# Patient Record
Sex: Female | Born: 2000 | Race: White | Hispanic: No | Smoking: Never smoker
Health system: Southern US, Community
[De-identification: ages and names within clinical notes are randomized; demographics above are authoritative.]

## PROBLEM LIST (undated history)

## (undated) ENCOUNTER — Inpatient Hospital Stay: Payer: Self-pay

## (undated) DIAGNOSIS — F909 Attention-deficit hyperactivity disorder, unspecified type: Secondary | ICD-10-CM

## (undated) DIAGNOSIS — F319 Bipolar disorder, unspecified: Secondary | ICD-10-CM

## (undated) DIAGNOSIS — T7840XA Allergy, unspecified, initial encounter: Secondary | ICD-10-CM

## (undated) HISTORY — PX: DENTAL SURGERY: SHX609

---
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
## 2020-10-20 ENCOUNTER — Other Ambulatory Visit: Payer: Self-pay

## 2020-10-20 ENCOUNTER — Emergency Department
Admission: EM | Admit: 2020-10-20 | Discharge: 2020-10-20 | Disposition: A | Discharge status: Home / Self Care | Payer: Medicaid - Out of State | Attending: Emergency Medicine | Admitting: Emergency Medicine

## 2020-10-20 DIAGNOSIS — Z87891 Personal history of nicotine dependence: Secondary | ICD-10-CM | POA: Diagnosis not present

## 2020-10-20 DIAGNOSIS — L03213 Periorbital cellulitis: Secondary | ICD-10-CM | POA: Diagnosis not present

## 2020-10-20 DIAGNOSIS — H5712 Ocular pain, left eye: Secondary | ICD-10-CM | POA: Diagnosis present

## 2020-10-20 MED ORDER — AMOXICILLIN-POT CLAVULANATE 875-125 MG PO TABS: 1.0000 | ORAL_TABLET | Freq: Two times a day (BID) | ORAL | 0 refills | Status: AC

## 2020-10-20 MED ORDER — FLUORESCEIN SODIUM 1 MG OP STRP
1.0000 | ORAL_STRIP | Freq: Once | OPHTHALMIC | Status: AC
  Administered 2020-10-20: 1 via OPHTHALMIC
  Filled 2020-10-20: qty 1

## 2020-10-20 MED ORDER — ERYTHROMYCIN 5 MG/GM OP OINT: 1.0000 "application " | TOPICAL_OINTMENT | Freq: Two times a day (BID) | OPHTHALMIC | 0 refills | Status: AC

## 2020-10-20 MED ORDER — TETRACAINE HCL 0.5 % OP SOLN
1.0000 [drp] | Freq: Once | OPHTHALMIC | Status: AC
  Administered 2020-10-20: 1 [drp] via OPHTHALMIC
  Filled 2020-10-20: qty 4

## 2020-10-20 NOTE — ED Provider Notes (Signed)
[facility] Emergency Department Provider Note  ____________________________________________   Event Date/Time   First MD Initiated Contact with Patient 10/20/20 1846     (approximate)  I have reviewed the triage vital signs and the nursing notes.   HISTORY  Chief Complaint Eye Pain   HPI Crystal Melendez is a 20 y.o. female with a past medical history of bipolar disorder and ADHD who presents for assessment of some left lower eye pain and redness with worsening tearing that she states began last night.  She does not wear contacts or glasses and does not think she was around any foreign bodies or metal objects could have gotten in her eye.  She has no symptoms in her right eye.  She has no double vision or blurry vision.  She is no earache, sore throat, chest pain, cough, shortness of breath, fevers, chills, Donnell pain, vomiting, diarrhea, dysuria, rash or any other acute sick symptoms.  No other acute concerns at this time         Past Medical History:  Diagnosis Date   ADHD    Allergy    Bipolar 1 disorder [facility])     Patient Active Problem List   Diagnosis Date Noted   Adjustment disorder with depressed mood    Labor and delivery indication for care or intervention 12/11/2018   Preterm uterine contractions in third trimester, antepartum 11/28/2018   Low back pain during pregnancy in third trimester 11/27/2018   Uterine contractions during pregnancy 11/27/2018   Pregnancy 11/26/2018   MDD (major depressive disorder), recurrent episode, severe (HCC) 05/14/2017   Bipolar 2 disorder (HCC) 04/24/2017    Past Surgical History:  Procedure Laterality Date   DENTAL SURGERY      Prior to Admission medications   Medication Sig Start Date End Date Taking? Authorizing Provider  amoxicillin-clavulanate (AUGMENTIN) 875-125 MG tablet Take 1 tablet by mouth 2 (two) times daily for 7 days. 10/20/20 10/27/20 Yes Gilles Chiquito, MD  erythromycin ophthalmic  ointment Place 1 application into the left eye in the morning and at bedtime for 3 days. 10/20/20 10/23/20 Yes Gilles Chiquito, MD  buPROPion (WELLBUTRIN) 75 MG tablet Take 150 mg by mouth every morning.    [provider]  folic acid (FOLVITE) 1 MG tablet Take 1 mg by mouth every morning.    [provider]  OXcarbazepine (TRILEPTAL) 150 MG tablet Take 150 mg by mouth 2 (two) times daily. For Bipolar 2    [provider]  Prenatal Vit-Fe Fumarate-FA (PRENATAL MULTIVITAMIN) TABS tablet Take 1 tablet by mouth daily at 12 noon.    [provider]  QUEtiapine (SEROQUEL) 50 MG tablet Take 1 tablet (50 mg total) by mouth at bedtime. 05/20/17   Leata Mouse, MD    Allergies Sulfa antibiotics  Family History  Problem Relation Age of Onset   Diabetes Maternal Grandmother    Bipolar disorder Maternal Grandmother     Social History Social History   Tobacco Use   Smoking status: Former   Smokeless tobacco: Never  Building services engineer Use: Some days  Substance Use Topics   Alcohol use: Never    Comment: did not know   Drug use: Not Currently    Frequency: 3.0 times per week    Types: Marijuana    Review of Systems  Review of Systems  Constitutional:  Negative for chills and fever.  HENT:  Negative for sore throat.   Eyes:  Positive  for pain and discharge.  Respiratory:  Negative for cough and stridor.   Cardiovascular:  Negative for chest pain.  Gastrointestinal:  Negative for vomiting.  Genitourinary:  Negative for dysuria.  Musculoskeletal:  Negative for myalgias.  Skin:  Negative for rash.  Neurological:  Negative for seizures, loss of consciousness and headaches.  Psychiatric/Behavioral:  Negative for suicidal ideas.   All other systems reviewed and are negative.    ____________________________________________   PHYSICAL EXAM:  VITAL SIGNS: ED Triage Vitals  Enc Vitals Group     BP 10/20/20 1516 116/72     Pulse --       Resp 10/20/20 1516 18     Temp 10/20/20 1516 98.9 F (37.2 C)     Temp Source 10/20/20 1516 Oral     SpO2 10/20/20 1516 96 %     Weight 10/20/20 1514 135 lb (61.2 kg)     Height 10/20/20 1514 5\' 1"  (1.549 m)     Head Circumference --      Peak Flow --      Pain Score 10/20/20 1513 5     Pain Loc --      Pain Edu? --      Excl. in GC? --    Vitals:   10/20/20 1516 10/20/20 1852  BP: 116/72   Pulse:  78  Resp: 18   Temp: 98.9 F (37.2 C)   SpO2: 96%    Physical Exam Vitals and nursing note reviewed.  Constitutional:      General: She is not in acute distress.    Appearance: She is well-developed.  HENT:     Head: Normocephalic and atraumatic.     Right Ear: External ear normal.     Left Ear: External ear normal.     Nose: Nose normal.     Mouth/Throat:     Mouth: Mucous membranes are moist.  Eyes:     Conjunctiva/sclera: Conjunctivae normal.  Cardiovascular:     Rate and Rhythm: Normal rate and regular rhythm.     Heart sounds: No murmur heard. Pulmonary:     Effort: Pulmonary effort is normal. No respiratory distress.  Abdominal:     General: There is no distension.     Palpations: Abdomen is soft.  Musculoskeletal:     Cervical back: Neck supple.  Skin:    General: Skin is warm and dry.     Capillary Refill: Capillary refill takes less than 2 seconds.  Neurological:     Mental Status: She is alert and oriented to person, place, and time.    Jewell acuity is 20/20 bilaterally.  Cranial nerves II through XII are grossly intact.  Oropharynx is unremarkable.  There is some edema in the lower eyelid without obvious stye or focal head.  Fluorescein staining bilaterally shows no abnormal uptake.  There is no proptosis. ____________________________________________   LABS (all labs ordered are listed, but only abnormal results are displayed)  Labs Reviewed - No data to  display ____________________________________________  EKG  ____________________________________________  RADIOLOGY  ED MD interpretation:    Official radiology report(s): No results found.  ____________________________________________   PROCEDURES  Procedure(s) performed (including Critical Care):  Procedures   ____________________________________________   INITIAL IMPRESSION / ASSESSMENT AND PLAN / ED COURSE      Patient resents with above-stated reexam for assessment of some redness pain and swelling in the left lower eyelid started last night associate with tearing.  On arrival she is afebrile hemodynamically stable.  On  exam her extraocular movements are intact and she has no proptosis.  There is some edema in the lower eyelid is quite tender.  No involvement upper eyelid or entrapment extraocular movements.  Visual acuity is intact and symmetric.  There is no abnormal fluorescein uptake to suggest corneal abrasion or ulcer.  There is no obvious stye or chalazion at this point.  I am concerned for possible early preseptal cellulitis.  There is no history exam features to suggest a preseptal cellulitis.  We will start Augmentin and erythromycin and have patient follow-up with ophthalmology.  Discharged stable condition.  Strict return precautions advised and discussed.         ____________________________________________   FINAL CLINICAL IMPRESSION(S) / ED DIAGNOSES  Final diagnoses:  Preseptal cellulitis of left eye    Medications  tetracaine (PONTOCAINE) 0.5 % ophthalmic solution 1-2 drop (1 drop Both Eyes Given 10/20/20 1852)  fluorescein ophthalmic strip 1 strip (1 strip Both Eyes Given by Other 10/20/20 1851)     ED Discharge Orders          Ordered    amoxicillin-clavulanate (AUGMENTIN) 875-125 MG tablet  2 times daily        10/20/20 1853    erythromycin ophthalmic ointment  2 times daily        10/20/20 1854             Note:  This  document was prepared using Dragon voice recognition software and may include unintentional dictation errors.    Gilles Chiquito, MD 10/20/20 [phone]

## 2020-10-20 NOTE — ED Triage Notes (Signed)
Pt to ED via POV. Pt c/o left eye pain that started this morning. Pt stating no relief from medication or warm compress.  Pt states pain radiates to cheek. Pt A&Ox4. [place]

## 2021-01-29 NOTE — L&D Delivery Note (Signed)
Obstetrical Delivery Note   Date of Delivery:   12/26/2021 Primary OB:   [facility]  Gestational Age/EDD: [redacted]w[redacted]d (Dated by LMP) Reason for Admission: Labor  Antepartum complications: BMI 33, bipolar disorder, anxiety   Delivered By:   Siri Cole, CNM   Delivery Type:   spontaneous vaginal delivery  Delivery Details:   Contractions started around 2 pm. She arrived in early active labor. VE 4/70/-3 then changed to 5.5cm.  She received 1 dose of IV pain medication.  Had an urge to push around 2100 and began to spontaneously bear down. SROM for clear while pushing. SVB of viable female at 2110 over intact perineum. .  Infant birthed OA to ROA, followed by easy shoulders. Placed on mother's abdomen. Spontaneous cry, HR > 100 and quick to pink up.  Cord doubly clamped and cut by aunt once pulsation ceased. Placenta delivered with maternal effort. Infant skin to skin with mom, both stable.  Anesthesia:    Fentanyl x 1  Intrapartum complications: None GBS:    Negative Laceration:    n/a Episiotomy:    none Rectal exam:   Deferred  Placenta:    Delivered and expressed via active management. Intact: yes. To pathology: no.  Delayed Cord Clamping: yes Estimated Blood Loss:   Baby:    Liveborn female, APGARs 8/9, weight pending gm  Carie Caddy, CNM   [facility]  12/26/2021

## 2021-02-09 ENCOUNTER — Ambulatory Visit: Payer: Medicaid - Out of State

## 2021-04-24 ENCOUNTER — Other Ambulatory Visit: Payer: Self-pay

## 2021-04-24 ENCOUNTER — Emergency Department: Payer: Medicaid - Out of State

## 2021-04-24 ENCOUNTER — Emergency Department
Admission: EM | Admit: 2021-04-24 | Discharge: 2021-04-24 | Disposition: A | Discharge status: Home / Self Care | Payer: Medicaid - Out of State | Attending: Emergency Medicine | Admitting: Emergency Medicine

## 2021-04-24 ENCOUNTER — Encounter: Payer: Self-pay | Admitting: Emergency Medicine

## 2021-04-24 DIAGNOSIS — N939 Abnormal uterine and vaginal bleeding, unspecified: Secondary | ICD-10-CM

## 2021-04-24 DIAGNOSIS — Z3A01 Less than 8 weeks gestation of pregnancy: Secondary | ICD-10-CM | POA: Diagnosis not present

## 2021-04-24 DIAGNOSIS — O209 Hemorrhage in early pregnancy, unspecified: Secondary | ICD-10-CM | POA: Diagnosis not present

## 2021-04-24 LAB — URINALYSIS, ROUTINE W REFLEX MICROSCOPIC
Bilirubin Urine: NEGATIVE
Glucose, UA: NEGATIVE mg/dL
Ketones, ur: NEGATIVE mg/dL
Nitrite: NEGATIVE
Protein, ur: NEGATIVE mg/dL
Specific Gravity, Urine: 1.006 (ref 1.005–1.030)
pH: 8 (ref 5.0–8.0)

## 2021-04-24 LAB — CBC WITH DIFFERENTIAL/PLATELET
Abs Immature Granulocytes: 0.02 10*3/uL (ref 0.00–0.07)
Basophils Absolute: 0 10*3/uL (ref 0.0–0.1)
Basophils Relative: 1 %
Eosinophils Absolute: 0.2 10*3/uL (ref 0.0–0.5)
Eosinophils Relative: 4 %
HCT: 42.3 % (ref 36.0–46.0)
Hemoglobin: 14.3 g/dL (ref 12.0–15.0)
Immature Granulocytes: 0 %
Lymphocytes Relative: 45 %
Lymphs Abs: 2.3 10*3/uL (ref 0.7–4.0)
MCH: 30.6 pg (ref 26.0–34.0)
MCHC: 33.8 g/dL (ref 30.0–36.0)
MCV: 90.6 fL (ref 80.0–100.0)
Monocytes Absolute: 0.4 10*3/uL (ref 0.1–1.0)
Monocytes Relative: 8 %
Neutro Abs: 2.2 10*3/uL (ref 1.7–7.7)
Neutrophils Relative %: 42 %
Platelets: 291 10*3/uL (ref 150–400)
RBC: 4.67 MIL/uL (ref 3.87–5.11)
RDW: 11.6 % (ref 11.5–15.5)
WBC: 5.2 10*3/uL (ref 4.0–10.5)
nRBC: 0 % (ref 0.0–0.2)

## 2021-04-24 LAB — BASIC METABOLIC PANEL
Anion gap: 8 (ref 5–15)
BUN: 8 mg/dL (ref 6–20)
CO2: 23 mmol/L (ref 22–32)
Calcium: 9.2 mg/dL (ref 8.9–10.3)
Chloride: 106 mmol/L (ref 98–111)
Creatinine, Ser: 0.68 mg/dL (ref 0.44–1.00)
GFR, Estimated: >60 mL/min (ref >60)
Glucose, Bld: 83 mg/dL (ref 70–99)
Potassium: 3.8 mmol/L (ref 3.5–5.1)
Sodium: 137 mmol/L (ref 135–145)

## 2021-04-24 LAB — HCG, QUANTITATIVE, PREGNANCY: hCG, Beta Chain, Quant, S: 164 m[IU]/mL — ABNORMAL HIGH (ref <5)

## 2021-04-24 LAB — ABO/RH: ABO/RH(D): O POS

## 2021-04-24 MED ORDER — METOCLOPRAMIDE HCL 10 MG PO TABS
10.0000 mg | ORAL_TABLET | Freq: Once | ORAL | Status: DC
  Filled 2021-04-24: qty 1

## 2021-04-24 MED ORDER — VITAMIN B-6 50 MG PO TABS: 50.0000 mg | ORAL_TABLET | Freq: Every day | ORAL | 0 refills | Status: AC

## 2021-04-24 NOTE — Discharge Instructions (Addendum)
-  Follow-up with the OB/GYN provider listed above, as discussed. ?-Return the emergency department anytime if you begin to experience any new or worsening symptoms. ?

## 2021-04-24 NOTE — ED Triage Notes (Signed)
C/o dark brown vaginal bleeding today.  Patient is pregnant, LMP:  03/25/2021.  G3 P2.  C/O dull back ache. ?

## 2021-04-24 NOTE — ED Provider Notes (Signed)
? ?[facility] ?Provider Note ? ? ? Event Date/Time  ? First MD Initiated Contact with Patient 04/24/21 1141   ?  (approximate) ? ? ?History  ? ?Chief Complaint ?Vaginal Bleeding ? ? ?HPI ?Crystal Melendez is a 21 y.o. female, G3 P2, history of bipolar 2, MDD, presents emergency department for evaluation of vaginal bleeding.  Patient states that she noticed brownish-red discharge this morning, particular when she wiped.  No clots present.  She states that she had some double back pain at first, however this is resolved.  She states that she has not had any more vaginal bleeding since the first episode.  She states that she thinks she is approximately [redacted] weeks pregnant.  Patient states that she is not concerned for STDs/STIs.  Last menstrual period was 03/25/2021.  She has never had any miscarriages.  Denies fever/chills, chest pain, shortness of breath, vomiting, diarrhea, urinary symptoms, ? ?History Limitations: No limitations. ? ?  ? ? ?Physical Exam  ?Triage Vital Signs: ?ED Triage Vitals [04/24/21 1124]  ?Enc Vitals Group  ?   BP   ?   Pulse   ?   Resp   ?   Temp   ?   Temp src   ?   SpO2   ?   Weight 134 lb 14.7 oz (61.2 kg)  ?   Height 5\' 1"  (1.549 m)  ?   Head Circumference   ?   Peak Flow   ?   Pain Score 0  ?   Pain Loc   ?   Pain Edu?   ?   Excl. in GC?   ? ? ?Most recent vital signs: ?Vitals:  ? 04/24/21 1253  ?BP: 100/66  ?Pulse: 92  ?Resp: 20  ?Temp: 98.9 ?F (37.2 ?C)  ?SpO2: 96%  ? ? ?General: Awake, NAD.  ?Skin: Warm, dry.  ?CV: Good peripheral perfusion.  ?Resp: Normal effort.  ?Abd: Soft, non-tender. No distention.  ?Neuro: At baseline. No gross neurological deficits.  ?Other: None. ? ?Physical Exam ? ? ? ?ED Results / Procedures / Treatments  ?Labs ?(all labs ordered are listed, but only abnormal results are displayed) ?Labs Reviewed  ?HCG, QUANTITATIVE, PREGNANCY - Abnormal; Notable for the following components:  ?    Result Value  ? hCG, Beta Chain, Quant, S 164 (*)   ? All  other components within normal limits  ?CBC WITH DIFFERENTIAL/PLATELET  ?BASIC METABOLIC PANEL  ?URINALYSIS, ROUTINE W REFLEX MICROSCOPIC  ?POC URINE PREG, ED  ?ABO/RH  ? ? ? ?EKG ?Not applicable. ? ? ?RADIOLOGY ? ?ED Provider Interpretation: I personally reviewed and interpreted this ultrasound, no intrauterine gestation identified. ? ?04/26/21 OB LESS THAN 14 WEEKS WITH OB TRANSVAGINAL ? ?Result Date: 04/24/2021 ?CLINICAL DATA:  Vaginal bleeding EXAM: OBSTETRIC <14 WK ULTRASOUND TECHNIQUE: Transabdominal ultrasound was performed for evaluation of the gestation as well as the maternal uterus and adnexal regions. COMPARISON:  None. FINDINGS: Intrauterine gestational sac: None Yolk sac:  Not Visualized. Embryo:  Not Visualized. Cardiac Activity: Not Visualized. Heart Rate: Not visualized. Subchorionic hemorrhage:  None visualized. Maternal uterus/adnexae: Corpus luteum of the right ovary. IMPRESSION: No candidate intrauterine gestation identified. Early pregnancy of uncertain location. Recommend ectopic precautions, serial beta HCG and follow-up ultrasound in 7-14 days to determine viability. Electronically Signed   By: 08[birth year]1997 M.D.   On: 04/24/2021 13:36   ? ?PROCEDURES: ? ?Critical Care performed: None. ? ?Procedures ? ? ? ?MEDICATIONS ORDERED IN ED: ?Medications  ?  metoCLOPramide (REGLAN) tablet 10 mg (10 mg Oral Not Given 04/24/21 1443)  ? ? ? ?IMPRESSION / MDM / ASSESSMENT AND PLAN / ED COURSE  ?I reviewed the triage vital signs and the nursing notes. ?             ?               ? ? ?Differential diagnosis includes, but is not limited to, ectopic pregnancy, subchorionic hemorrhage, spontaneous abortion, cervical polyps, fibroids, gestational trophoblastic disease, cervicitis. ? ?ED Course ?Patient appears well.  Vital signs within normal limits.  NAD. ? ?CBC shows no leukocytosis or anemia.  BMP shows no evidence of electrolyte abnormalities or kidney injury. ? ?Beta-hCG 164. ? ?CBC shows no evidence of  leukocytosis or anemia.  BMP unremarkable ? ? ? ?Assessment/Plan ?Patient presents with complaint of vaginal bleeding.  History is not concerning for ectopic pregnancy, though ultrasound does not show intrauterine pregnancy at this time.  Given her presentation, I do not suspect any serious or life-threatening pathology.  Advised patient to follow-up with OB/GYN for serial beta hCGs and follow-up ultrasound.  Patient expressed understanding and agreed with the plan.  We will provide her with a prescription for pyridoxine for nausea management. ? ?Considered admission for this patient, but given the patient's stable presentation, normal vitals, she is unlikely to benefit from admission. ? ?Patient was provided with anticipatory guidance, return precautions, and educational material. Encouraged the patient to return to the emergency department at any time if they begin to experience any new or worsening symptoms.  ? ?  ? ? ?FINAL CLINICAL IMPRESSION(S) / ED DIAGNOSES  ? ?Final diagnoses:  ?Vaginal bleeding  ? ? ? ?Rx / DC Orders  ? ?ED Discharge Orders   ? ?      Ordered  ?  pyridOXINE (VITAMIN B-6) 50 MG tablet  Daily       ? 04/24/21 1447  ? ?  ?  ? ?  ? ? ? ?Note:  This document was prepared using Dragon voice recognition software and may include unintentional dictation errors. ?  ?Varney Daily, [place] ?04/24/21 1524 ? ?  ?Jene Every, MD ?04/25/21 [phone] ? ?

## 2021-04-25 ENCOUNTER — Other Ambulatory Visit: Payer: Self-pay

## 2021-04-25 ENCOUNTER — Emergency Department
Admission: EM | Admit: 2021-04-25 | Discharge: 2021-04-25 | Disposition: A | Discharge status: Home / Self Care | Payer: Medicaid - Out of State | Attending: Emergency Medicine | Admitting: Emergency Medicine

## 2021-04-25 ENCOUNTER — Encounter: Payer: Self-pay | Admitting: Emergency Medicine

## 2021-04-25 DIAGNOSIS — M545 Low back pain, unspecified: Secondary | ICD-10-CM | POA: Diagnosis not present

## 2021-04-25 DIAGNOSIS — O99891 Other specified diseases and conditions complicating pregnancy: Secondary | ICD-10-CM | POA: Insufficient documentation

## 2021-04-25 DIAGNOSIS — O26891 Other specified pregnancy related conditions, first trimester: Secondary | ICD-10-CM

## 2021-04-25 DIAGNOSIS — R300 Dysuria: Secondary | ICD-10-CM | POA: Diagnosis not present

## 2021-04-25 MED ORDER — CEPHALEXIN 500 MG PO CAPS: 500.0000 mg | ORAL_CAPSULE | Freq: Four times a day (QID) | ORAL | 0 refills | Status: AC

## 2021-04-25 NOTE — Discharge Instructions (Addendum)
Take the antibiotic as directed. Follow-up with your intended OB provider.  ?

## 2021-04-25 NOTE — ED Triage Notes (Signed)
Pt to ED via POV with c/o of tailbone pain, she was here yesterday for brown spotting, she is no longer spotting. She has blood work and urine done yesterday. She is afraid she is having an etopic pregnancy.  ?

## 2021-04-25 NOTE — ED Provider Notes (Signed)
? ? ?[facility] ?Emergency Department Provider Note ? ? ? ? Event Date/Time  ? First MD Initiated Contact with Patient 04/25/21 1733   ?  (approximate) ? ? ?History  ? ?Tailbone Pain ? ? ?HPI ? ?Crystal Melendez is a 21 y.o. female G3P2, returns to the ED 1 day after being evaluated for some scant brown spotting, after a recent LEEP confirmed pregnancy with a home pregnancy test.  Patient presents today denies any ongoing vaginal discharge or spotting.  She denies any fever, chills, sweats.  She is voicing some concern over anxiety about the possibility that she may be having an ectopic pregnancy.  Patient was found yesterday to have a beta quant of 164.  She has adjusted her LMP from 2/25, to 3/1.  She took the home pregnancy test before her next expected menses.  She does endorse some low back pain at this time. ? ? ?Physical Exam  ? ?Triage Vital Signs: ?ED Triage Vitals  ?Enc Vitals Group  ?   BP 04/25/21 1704 122/81  ?   Pulse Rate 04/25/21 1704 89  ?   Resp 04/25/21 1704 18  ?   Temp 04/25/21 1704 99.1 ?F (37.3 ?C)  ?   Temp Source 04/25/21 1704 Oral  ?   SpO2 04/25/21 1704 100 %  ?   Weight 04/25/21 1706 134 lb 14.7 oz (61.2 kg)  ?   Height 04/25/21 1706 5\' 1"  (1.549 m)  ?   Head Circumference --   ?   Peak Flow --   ?   Pain Score 04/25/21 1706 1  ?   Pain Loc --   ?   Pain Edu? --   ?   Excl. in GC? --   ? ? ?Most recent vital signs: ?Vitals:  ? 04/25/21 1704  ?BP: 122/81  ?Pulse: 89  ?Resp: 18  ?Temp: 99.1 ?F (37.3 ?C)  ?SpO2: 100%  ? ? ?General Awake, no distress.  ?CV:  Good peripheral perfusion.  ?RESP:  Normal effort.  ?ABD:  No distention.  ?GU:   Deferred ? ?ED Results / Procedures / Treatments  ? ?Labs ?(all labs ordered are listed, but only abnormal results are displayed) ?Labs Reviewed - No data to display ? ? ?EKG ? ? ?RADIOLOGY ? ?No results found. ? ? ?PROCEDURES: ? ?Critical Care performed: No ? ?Procedures ? ? ?MEDICATIONS ORDERED IN ED: ?Medications - No data to  display ? ? ?IMPRESSION / MDM / ASSESSMENT AND PLAN / ED COURSE  ?I reviewed the triage vital signs and the nursing notes. ?             ?               ? ?Differential diagnosis includes, but is not limited to, anxiety, bacteruria in pregnancy, musculoskeletal strain ? ?Patient to the ED with concern over possible ectopic pregnancy after her visit yesterday.  Patient was confirmed yesterday with a beta quant of 164.  She has not ultrasound report that does not reveal an confirmed IUP.  This is likely due to an early pregnancy, to new to be evaluated by ultrasound.  Patient presents today without any ongoing complaints of spotting, bleeding, cramping, or pelvic pain.  She does endorse some bilateral low back pain.  Review of patient's labs from yesterday also reveals a moderate bacteriuria.  As such, patient will be treated empirically with Keflex for UTI in pregnancy.  Patient's diagnosis is consistent with early pregnancy  with a positive beta hCG.  I discussed at length the patient's need to remain calm and optimistic as her pregnancy is very early based on her dates and quant values.  I reassured her that majority of pregnancies to experience some pressure menstrual bleeding, but also go on to have normal viable pregnancy development.  Patient voices her reassurance at the time and will follow-up as intended.  Patient is to follow up with her intended OB provider as needed or otherwise directed. Patient is given ED precautions to return to the ED for any worsening or new symptoms. ? ? ?FINAL CLINICAL IMPRESSION(S) / ED DIAGNOSES  ? ?Final diagnoses:  ?Acute bilateral low back pain without sciatica  ?Dysuria during pregnancy in first trimester  ? ? ? ?Rx / DC Orders  ? ?ED Discharge Orders   ? ?      Ordered  ?  cephALEXin (KEFLEX) 500 MG capsule  4 times daily       ? 04/25/21 1826  ? ?  ?  ? ?  ? ? ? ?Note:  This document was prepared using Dragon voice recognition software and may include unintentional  dictation errors. ? ?  ?Lissa Hoard, PA-C ?04/25/21 1840 ? ?  ?Arnaldo Natal, MD ?04/25/21 2324 ? ?

## 2021-04-27 ENCOUNTER — Ambulatory Visit (LOCAL_COMMUNITY_HEALTH_CENTER): Payer: Self-pay

## 2021-04-27 VITALS — BP 106/67 | Ht 61.0 in | Wt 122.5 lb

## 2021-04-27 DIAGNOSIS — Z3201 Encounter for pregnancy test, result positive: Secondary | ICD-10-CM

## 2021-04-27 LAB — PREGNANCY, URINE: Preg Test, Ur: POSITIVE — AB

## 2021-04-27 MED ORDER — PRENATAL 27-0.8 MG PO TABS: 1.0000 | ORAL_TABLET | Freq: Every day | ORAL | 0 refills | Status: AC

## 2021-04-27 NOTE — Progress Notes (Signed)
UPT positive. Plans prenatal care at [facility]; walked pt to appt desk to schedule prenatal appt.  States was seen at ER 04/24/21 due to vaginal discharge and was given keflex for possible UTI.  Reports had HCG level drawn at ER 04/24/21 and again today.  ?Prenatal vitamins/instructions given, discussed when to seek medical attention if any problems, written info given re: safe meds during pregnancy.  Cherlynn Polo, RN  ?

## 2021-06-01 ENCOUNTER — Encounter: Payer: Self-pay | Admitting: Obstetrics

## 2021-06-01 ENCOUNTER — Ambulatory Visit (INDEPENDENT_AMBULATORY_CARE_PROVIDER_SITE_OTHER): Payer: Medicaid Other | Admitting: Obstetrics

## 2021-06-01 VITALS — BP 115/77 | HR 111 | Wt 129.3 lb

## 2021-06-01 DIAGNOSIS — Z3481 Encounter for supervision of other normal pregnancy, first trimester: Secondary | ICD-10-CM | POA: Diagnosis not present

## 2021-06-01 LAB — POCT URINALYSIS DIPSTICK OB
Bilirubin, UA: NEGATIVE
Blood, UA: NEGATIVE
Glucose, UA: NEGATIVE
Ketones, UA: NEGATIVE
Leukocytes, UA: NEGATIVE
Nitrite, UA: NEGATIVE
POC,PROTEIN,UA: NEGATIVE
Spec Grav, UA: 1.015 (ref 1.010–1.025)
Urobilinogen, UA: 0.2 E.U./dL
pH, UA: 7 (ref 5.0–8.0)

## 2021-06-01 NOTE — Progress Notes (Signed)
SUBJECTIVE ? ?DAWNELL BRYANT is a 21 y.o. female who presents for a transfer and establishment of prenatal care. Her LMP was Patient's last menstrual period was 03/29/2021 (approximate).  Her EDC date was confirmed via [place] at [redacted]w[redacted]d. ?Pregnancy is desired. ?Current symptoms includefatigue, nausea, and food aversions ? ?Review of Systems ?Pertinent items are noted in HPI. ? ?OBJECTIVE ?BP 115/77   Pulse (!) 111   Wt 129 lb 4.8 oz (58.7 kg)   LMP 03/29/2021 (Approximate)   BMI 24.43 kg/m?  ?Body mass index is 24.43 kg/m?. ? ?alert, well appearing, and in no distress ? ? ?ASSESSMENT ?Pregnancy at [redacted]w[redacted]d with EDD of Estimated Date of Delivery: 01/03/22. ? ?PLAN  ?Oriented to practice. Encouraged a well-balanced diet, rest, hydration, prenatal vitamins, and walking for exercise. Counseled to avoid alcohol, tobacco, and recreational drugs and to minimize caffeine intake. Safe medications list given. Discussed non-pharmacologic relief measures for nausea.  Genetic screening options were discussed.  A dating [place] was offered done on 05/19/21 and showed a [redacted]w[redacted]d IUP. Reviewed how to contact a provider.Return in 3-4 weeks for a nurse visit, labs, prenatal education, a NOB physical and genetic screening if desired.  ? ?[name], CNM  ?

## 2021-06-20 ENCOUNTER — Other Ambulatory Visit: Payer: Medicaid Other

## 2021-06-23 ENCOUNTER — Other Ambulatory Visit: Payer: Medicaid Other

## 2021-06-23 ENCOUNTER — Ambulatory Visit (INDEPENDENT_AMBULATORY_CARE_PROVIDER_SITE_OTHER): Payer: Medicaid Other

## 2021-06-23 DIAGNOSIS — Z113 Encounter for screening for infections with a predominantly sexual mode of transmission: Secondary | ICD-10-CM

## 2021-06-23 DIAGNOSIS — Z3481 Encounter for supervision of other normal pregnancy, first trimester: Secondary | ICD-10-CM

## 2021-06-23 DIAGNOSIS — Z0283 Encounter for blood-alcohol and blood-drug test: Secondary | ICD-10-CM

## 2021-06-23 DIAGNOSIS — Z1379 Encounter for other screening for genetic and chromosomal anomalies: Secondary | ICD-10-CM

## 2021-06-23 DIAGNOSIS — Z3480 Encounter for supervision of other normal pregnancy, unspecified trimester: Secondary | ICD-10-CM | POA: Insufficient documentation

## 2021-06-23 DIAGNOSIS — Z3A00 Weeks of gestation of pregnancy not specified: Secondary | ICD-10-CM

## 2021-06-23 NOTE — Initial Assessments (Signed)
New OB Intake  I connected with  Crystal Melendez on 06/23/21 at  3:15 PM EDT by telephone and verified that I am speaking with the correct person using two identifiers. Nurse is located at [facility] and pt is located at home.  I explained I am completing New OB Intake today. We discussed her EDD of 01/03/2022 that is based on LMP of 03/29/2021. Pt is G3/P2002. I reviewed her allergies, medications, Medical/Surgical/OB history, and appropriate screenings. Based on history, this is a/an pregnancy uncomplicated .   Patient Active Problem List   Diagnosis Date Noted   Supervision of other normal pregnancy, antepartum 06/23/2021   Adjustment disorder with depressed mood    Labor and delivery indication for care or intervention 12/11/2018   Preterm uterine contractions in third trimester, antepartum 11/28/2018   Low back pain during pregnancy in third trimester 11/27/2018   Uterine contractions during pregnancy 11/27/2018   Pregnancy 11/26/2018   MDD (major depressive disorder), recurrent episode, severe (HCC) 05/14/2017   Bipolar 2 disorder (HCC) 04/24/2017    Concerns addressed today none  Delivery Plans:  Plans to deliver at [facility]  [place] Explained first scheduled [place] will be around 1-2 weeks after NOB appt c provider.  Labs Discussed c patient that genetic testing can be drawn at new OB visit. Discussed possible labs to be drawn at new OB appointment.  COVID Vaccine Patient has had COVID vaccine.   Social Determinants of Health Food Insecurity: denies food insecurity Transportation: Patient denies transportation needs.  First visit review I reviewed new OB appt with pt. I explained she will have ob bloodwork and pap smear/pelvic exam if indicated. Explained pt will be seen by [name], CNM at first visit; encounter routed to appropriate provider.   Loran Senters, [facility] 06/23/2021  3:14 PM  Clinical Staff Provider  Office Location  [facility] Dating     Language  English Anatomy US    Flu Vaccine   Genetic Screen  NIPS:   TDaP vaccine   UTD Hgb A1C or  GTT Early : Third trimester :   Covid UTD   LAB RESULTS   Rhogam   Blood Type --/--/O POS Performed at [facility], [address] [place], [place] [address]  [phone])   Feeding Plan undecided Antibody    Contraception unsure Rubella    Circumcision yes RPR     Pediatrician  undecided HBsAg     Support Person Johnnel HIV    Prenatal Classes no Varicella     GBS  (For PCN allergy, check sensitivities)   BTL Consent  Hep C   VBAC Consent  Pap      Hgb Electro      CF      SMA

## 2021-06-24 LAB — VIRAL HEPATITIS HBV, HCV
HCV Ab: NONREACTIVE
Hep B Core Total Ab: NEGATIVE
Hep B Surface Ab, Qual: NONREACTIVE
Hepatitis B Surface Ag: NEGATIVE

## 2021-06-24 LAB — URINALYSIS, ROUTINE W REFLEX MICROSCOPIC
Bilirubin, UA: NEGATIVE
Glucose, UA: NEGATIVE
Ketones, UA: NEGATIVE
Nitrite, UA: NEGATIVE
Protein,UA: NEGATIVE
RBC, UA: NEGATIVE
Specific Gravity, UA: 1.011 (ref 1.005–1.030)
Urobilinogen, Ur: 0.2 mg/dL (ref 0.2–1.0)
pH, UA: 7 (ref 5.0–7.5)

## 2021-06-24 LAB — RPR: RPR Ser Ql: NONREACTIVE

## 2021-06-24 LAB — MICROSCOPIC EXAMINATION
Bacteria, UA: NONE SEEN
Casts: NONE SEEN /lpf
RBC, Urine: NONE SEEN /hpf (ref 0–2)
WBC, UA: NONE SEEN /hpf (ref 0–5)

## 2021-06-24 LAB — ABO AND RH: Rh Factor: POSITIVE

## 2021-06-24 LAB — TOXOPLASMA ANTIBODIES- IGG AND  IGM
Toxoplasma Antibody- IgM: <3 AU/mL (ref 0.0–7.9)
Toxoplasma IgG Ratio: <3 IU/mL (ref 0.0–7.1)

## 2021-06-24 LAB — HIV ANTIBODY (ROUTINE TESTING W REFLEX): HIV Screen 4th Generation wRfx: NONREACTIVE

## 2021-06-24 LAB — HCV INTERPRETATION

## 2021-06-24 LAB — RUBELLA SCREEN: Rubella Antibodies, IGG: 1.11 index (ref >0.99)

## 2021-06-24 LAB — ANTIBODY SCREEN: Antibody Screen: NEGATIVE

## 2021-06-24 LAB — VARICELLA ZOSTER ANTIBODY, IGG: Varicella zoster IgG: 245 index (ref >165)

## 2021-06-25 LAB — CULTURE, OB URINE

## 2021-06-25 LAB — URINE CULTURE, OB REFLEX

## 2021-06-27 LAB — MONITOR DRUG PROFILE 14(MW)
Amphetamine Scrn, Ur: NEGATIVE ng/mL
BARBITURATE SCREEN URINE: NEGATIVE ng/mL
BENZODIAZEPINE SCREEN, URINE: NEGATIVE ng/mL
Buprenorphine, Urine: NEGATIVE ng/mL
CANNABINOIDS UR QL SCN: NEGATIVE ng/mL
Cocaine (Metab) Scrn, Ur: NEGATIVE ng/mL
Creatinine(Crt), U: 37.6 mg/dL (ref 20.0–300.0)
Fentanyl, Urine: NEGATIVE pg/mL
Meperidine Screen, Urine: NEGATIVE ng/mL
Methadone Screen, Urine: NEGATIVE ng/mL
OXYCODONE+OXYMORPHONE UR QL SCN: NEGATIVE ng/mL
Opiate Scrn, Ur: NEGATIVE ng/mL
Ph of Urine: 7.6 (ref 4.5–8.9)
Phencyclidine Qn, Ur: NEGATIVE ng/mL
Propoxyphene Scrn, Ur: NEGATIVE ng/mL
SPECIFIC GRAVITY: 1.012
Tramadol Screen, Urine: NEGATIVE ng/mL

## 2021-06-27 LAB — NICOTINE SCREEN, URINE: Cotinine Ql Scrn, Ur: NEGATIVE ng/mL

## 2021-06-27 LAB — GC/CHLAMYDIA PROBE AMP
Chlamydia trachomatis, NAA: NEGATIVE
Neisseria Gonorrhoeae by PCR: NEGATIVE

## 2021-06-29 ENCOUNTER — Encounter: Payer: Medicaid Other | Admitting: Obstetrics

## 2021-07-01 LAB — MATERNIT21  PLUS CORE+ESS+SCA, BLOOD
11q23 deletion (Jacobsen): NOT DETECTED
15q11 deletion (PW Angelman): NOT DETECTED
1p36 deletion syndrome: NOT DETECTED
22q11 deletion (DiGeorge): NOT DETECTED
4p16 deletion(Wolf-Hirschhorn): NOT DETECTED
5p15 deletion (Cri-du-chat): NOT DETECTED
8q24 deletion (Langer-Giedion): NOT DETECTED
Fetal Fraction: 10
Monosomy X (Turner Syndrome): NOT DETECTED
Result (T21): NEGATIVE
Trisomy 13 (Patau syndrome): NEGATIVE
Trisomy 16: NOT DETECTED
Trisomy 18 (Edwards syndrome): NEGATIVE
Trisomy 21 (Down syndrome): NEGATIVE
Trisomy 22: NOT DETECTED
XXX (Triple X Syndrome): NOT DETECTED
XXY (Klinefelter Syndrome): NOT DETECTED
XYY (Jacobs Syndrome): NOT DETECTED

## 2021-07-03 ENCOUNTER — Emergency Department
Admission: EM | Admit: 2021-07-03 | Discharge: 2021-07-03 | Disposition: A | Discharge status: Home / Self Care | Payer: Medicaid Other | Attending: Student in an Organized Health Care Education/Training Program | Admitting: Student in an Organized Health Care Education/Training Program

## 2021-07-03 ENCOUNTER — Other Ambulatory Visit: Payer: Self-pay

## 2021-07-03 ENCOUNTER — Encounter: Payer: Self-pay | Admitting: *Deleted

## 2021-07-03 ENCOUNTER — Emergency Department: Payer: Medicaid Other

## 2021-07-03 DIAGNOSIS — R1013 Epigastric pain: Secondary | ICD-10-CM | POA: Insufficient documentation

## 2021-07-03 DIAGNOSIS — O26891 Other specified pregnancy related conditions, first trimester: Secondary | ICD-10-CM | POA: Insufficient documentation

## 2021-07-03 DIAGNOSIS — Z3A13 13 weeks gestation of pregnancy: Secondary | ICD-10-CM | POA: Diagnosis not present

## 2021-07-03 DIAGNOSIS — R1011 Right upper quadrant pain: Secondary | ICD-10-CM | POA: Insufficient documentation

## 2021-07-03 DIAGNOSIS — R079 Chest pain, unspecified: Secondary | ICD-10-CM | POA: Insufficient documentation

## 2021-07-03 LAB — BASIC METABOLIC PANEL
Anion gap: 7 (ref 5–15)
BUN: 9 mg/dL (ref 6–20)
CO2: 23 mmol/L (ref 22–32)
Calcium: 9.3 mg/dL (ref 8.9–10.3)
Chloride: 109 mmol/L (ref 98–111)
Creatinine, Ser: 0.55 mg/dL (ref 0.44–1.00)
GFR, Estimated: >60 mL/min (ref >60)
Glucose, Bld: 74 mg/dL (ref 70–99)
Potassium: 3.6 mmol/L (ref 3.5–5.1)
Sodium: 139 mmol/L (ref 135–145)

## 2021-07-03 LAB — HEPATIC FUNCTION PANEL
ALT: 19 U/L (ref 0–44)
AST: 18 U/L (ref 15–41)
Albumin: 3.7 g/dL (ref 3.5–5.0)
Alkaline Phosphatase: 47 U/L (ref 38–126)
Bilirubin, Direct: <0.1 mg/dL (ref 0.0–0.2)
Total Bilirubin: 0.5 mg/dL (ref 0.3–1.2)
Total Protein: 7 g/dL (ref 6.5–8.1)

## 2021-07-03 LAB — LIPASE, BLOOD: Lipase: 32 U/L (ref 11–51)

## 2021-07-03 LAB — CBC
HCT: 38.5 % (ref 36.0–46.0)
Hemoglobin: 13.3 g/dL (ref 12.0–15.0)
MCH: 31.4 pg (ref 26.0–34.0)
MCHC: 34.5 g/dL (ref 30.0–36.0)
MCV: 90.8 fL (ref 80.0–100.0)
Platelets: 262 10*3/uL (ref 150–400)
RBC: 4.24 MIL/uL (ref 3.87–5.11)
RDW: 11.9 % (ref 11.5–15.5)
WBC: 8.9 10*3/uL (ref 4.0–10.5)
nRBC: 0 % (ref 0.0–0.2)

## 2021-07-03 LAB — TROPONIN I (HIGH SENSITIVITY): Troponin I (High Sensitivity): <2 ng/L (ref <18)

## 2021-07-03 MED ORDER — ALUM & MAG HYDROXIDE-SIMETH 200-200-20 MG/5ML PO SUSP
30.0000 mL | Freq: Once | ORAL | Status: AC
  Administered 2021-07-03: 30 mL via ORAL
  Filled 2021-07-03: qty 30

## 2021-07-03 MED ORDER — FAMOTIDINE 20 MG PO TABS: 20.0000 mg | ORAL_TABLET | Freq: Two times a day (BID) | ORAL | 0 refills | Status: DC

## 2021-07-03 MED ORDER — LIDOCAINE VISCOUS HCL 2 % MT SOLN
15.0000 mL | Freq: Once | OROMUCOSAL | Status: AC
  Administered 2021-07-03: 15 mL via ORAL
  Filled 2021-07-03: qty 15

## 2021-07-03 MED ORDER — SUCRALFATE 1 G PO TABS: 1.0000 g | ORAL_TABLET | Freq: Three times a day (TID) | ORAL | 0 refills | Status: DC | PRN

## 2021-07-03 NOTE — Discharge Instructions (Signed)

## 2021-07-03 NOTE — ED Notes (Signed)
Pt signed esignature  d/c inst to pt.   

## 2021-07-03 NOTE — ED Triage Notes (Signed)
Pt has upper abd pain.  Pt took tums without relief.  Pt has nausea.pt states pain is also in her chest.   Pt is approx [redacted] weeks pregnant.  No vag bleeding.  No urinary sx.  Pt alert speech clear.

## 2021-07-03 NOTE — ED Provider Notes (Signed)
[facility] Provider Note    Event Date/Time   First MD Initiated Contact with Patient 07/03/21 2133     (approximate)   History   Abdominal Pain and Chest Pain   HPI  Crystal Melendez is a 21 y.o. female who currently [redacted] weeks pregnant presents to the ER for evaluation of epigastric pain.  Took some Tums without much relief.  Did eat spicy wings this evening.  States the pain goes into her chest.  Denies any shortness of breath.  No pleuritic pain.  No diarrhea.  No vaginal bleeding or discharge.     Physical Exam   Triage Vital Signs: ED Triage Vitals  Enc Vitals Group     BP 07/03/21 2008 120/75     Pulse Rate 07/03/21 2008 (!) 107     Resp 07/03/21 2008 18     Temp 07/03/21 2008 98.7 F (37.1 C)     Temp Source 07/03/21 2008 Oral     SpO2 07/03/21 2008 99 %     Weight 07/03/21 2009 133 lb (60.3 kg)     Height 07/03/21 2009 5\' 1"  (1.549 m)     Head Circumference --      Peak Flow --      Pain Score 07/03/21 2008 5     Pain Loc --      Pain Edu? --      Excl. in GC? --     Most recent vital signs: Vitals:   07/03/21 2008  BP: 120/75  Pulse: (!) 107  Resp: 18  Temp: 98.7 F (37.1 C)  SpO2: 99%     Constitutional: Alert  Eyes: Conjunctivae are normal.  Head: Atraumatic. Nose: No congestion/rhinnorhea. Mouth/Throat: Mucous membranes are moist.   Neck: Painless ROM.  Cardiovascular:   Good peripheral circulation. Respiratory: Normal respiratory effort.  No retractions.  Gastrointestinal: Soft and with mild epigastric ttp Musculoskeletal:  no deformity Neurologic:  MAE spontaneously. No gross focal neurologic deficits are appreciated.  Skin:  Skin is warm, dry and intact. No rash noted. Psychiatric: Mood and affect are normal. Speech and behavior are normal.    ED Results / Procedures / Treatments   Labs (all labs ordered are listed, but only abnormal results are displayed) Labs Reviewed  BASIC METABOLIC PANEL  CBC   HEPATIC FUNCTION PANEL  LIPASE, BLOOD  TROPONIN I (HIGH SENSITIVITY)  TROPONIN I (HIGH SENSITIVITY)     EKG  ED ECG REPORT I, 09/02/21, the attending physician, personally viewed and interpreted this ECG.   Date: 07/03/2021  EKG Time: 20:15  Rate: 100  Rhythm: sinus  Axis: normal  Intervals:normal  ST&T Change: no stemi, no depressions    RADIOLOGY Please see ED Course for my review and interpretation.  I personally reviewed all radiographic images ordered to evaluate for the above acute complaints and reviewed radiology reports and findings.  These findings were personally discussed with the patient.  Please see medical record for radiology report.    PROCEDURES:  Critical Care performed:   Procedures   MEDICATIONS ORDERED IN ED: Medications  alum & mag hydroxide-simeth (MAALOX/MYLANTA) 200-200-20 MG/5ML suspension 30 mL (30 mLs Oral Given 07/03/21 2145)    And  lidocaine (XYLOCAINE) 2 % viscous mouth solution 15 mL (15 mLs Oral Given 07/03/21 2145)     IMPRESSION / MDM / ASSESSMENT AND PLAN / ED COURSE  I reviewed the triage vital signs and the nursing notes.  Differential diagnosis includes, but is not limited to, cholelithiasis, cholecystitis, gastritis, enteritis,  Patient presented to the ER for evaluation of symptoms as described above.  She is clinically well-appearing in no acute distress with some mild epigastric pain.  Given her presentation and history I think this is more likely reflux or esophagitis.  Will give GI cocktail and reassess she does have some pain on right upper quadrant therefore will order right upper quadrant ultrasound.   Clinical Course as of 07/03/21 2313  Mon Jul 03, 2021  2228 Reassessed.  She feels improved after GI cocktail.  Not having any chest discomfort.  Very low suspicion for PE.  Ultrasound on my interpretation does not show any evidence of cholelithiasis.  Will await formal radiology  report. [PR]  2258 Ultrasound formal report is reassuring.  Patient feels well in no acute distress.  Does appear stable and appropriate for outpatient follow-up [PR]    Clinical Course User Index [PR] Willy Eddy, MD    Patient's presentation is most consistent with    FINAL CLINICAL IMPRESSION(S) / ED DIAGNOSES   Final diagnoses:  Epigastric pain     Rx / DC Orders   ED Discharge Orders          Ordered    sucralfate (CARAFATE) 1 g tablet  3 times daily PRN,   Status:  Discontinued        07/03/21 2300    famotidine (PEPCID) 20 MG tablet  2 times daily        07/03/21 2305             Note:  This document was prepared using Dragon voice recognition software and may include unintentional dictation errors.    Willy Eddy, MD 07/03/21 [phone]

## 2021-07-10 ENCOUNTER — Ambulatory Visit (INDEPENDENT_AMBULATORY_CARE_PROVIDER_SITE_OTHER): Payer: Medicaid Other | Admitting: Obstetrics

## 2021-07-10 VITALS — BP 115/74 | HR 88 | Wt 126.9 lb

## 2021-07-10 DIAGNOSIS — Z3482 Encounter for supervision of other normal pregnancy, second trimester: Secondary | ICD-10-CM

## 2021-07-10 NOTE — Progress Notes (Signed)
NEW OB HISTORY AND PHYSICAL  SUBJECTIVE:       Crystal Melendez is a 21 y.o. G78P2002 female, Patient's last menstrual period was 03/29/2021 (approximate)., Estimated Date of Delivery: 01/03/22, [redacted]w[redacted]d, presents today for establishment of Prenatal Care. She was recently seen in the ED for severe reflux but reports that symptoms have resolved.   Social history Partner/Relationship: FOB involved Living situation: lives with husband, parents, 2 kids Work: [employer] Exercise: treadmill Substance [id number] EtOH, tobacco, recreational drugs. Former vape use   Gynecologic History Patient's last menstrual period was 03/29/2021 (approximate).  Contraception: none Last Pap: N/A due to age.   Obstetric History OB History  Gravida Para Term Preterm AB Living  3 2 2  0 0 2  SAB IAB Ectopic Multiple Live Births  0 0 0 0 2    # Outcome Date GA Lbr Len/2nd Weight Sex Delivery Anes PTL Lv  3 Current           2 Term 07/04/20   6 lb 8 oz (2.948 kg) M Vag-Spont   LIV  1 Term 12/11/18 [redacted]w[redacted]d 04:18 / 00:11 6 lb 5.9 oz (2.89 kg) M Vag-Spont None Y LIV    Past Medical History:  Diagnosis Date   ADHD    states when little; on no medication   Bipolar 1 disorder ([place])    states dx'ed years ago; no medication    Past Surgical History:  Procedure Laterality Date   DENTAL SURGERY     all four age 53    Current Outpatient Medications on File Prior to Visit  Medication Sig Dispense Refill   Prenatal Vit-Fe Fumarate-FA (MULTIVITAMIN-PRENATAL) 27-0.8 MG TABS tablet Take 1 tablet by mouth daily at 12 noon. 100 tablet 0   famotidine (PEPCID) 20 MG tablet Take 1 tablet (20 mg total) by mouth 2 (two) times daily. 20 tablet 0   No current facility-administered medications on file prior to visit.    Allergies  Allergen Reactions   Sulfa Antibiotics Rash    Social History   Socioeconomic History   Marital status: Married    Spouse name: Not on file   Number of children: 2   Years of education: 12    Highest education level: Not on file  Occupational History   Occupation: Ship broker  Tobacco Use   Smoking status: Never   Smokeless tobacco: Never  Vaping Use   Vaping Use: Former   Substances: Nicotine   Devices: last use one month ago  Substance and Sexual Activity   Alcohol use: Not Currently    Comment: last use 4 years ago   Drug use: Not Currently    Frequency: 3.0 times per week    Types: Marijuana    Comment: last use 4 years ago   Sexual activity: Yes    Birth control/protection: Pill    Comment: nexplanon removed 4-6 months ago; last used progestin only pill before current pregnancy  Other Topics Concern   Not on file  Social History Narrative   Not on file   Social Determinants of Health   Financial Resource Strain: Low Risk  (06/23/2021)   Overall Financial Resource Strain (CARDIA)    Difficulty of Paying Living Expenses: Not hard at all  Food Insecurity: No Food Insecurity (06/23/2021)   Hunger Vital Sign    Worried About Running Out of Food in the Last Year: Never true    Ran Out of Food in the Last Year: Never true  Transportation Needs: No Transportation Needs (  06/23/2021)   PRAPARE - Hydrologist (Medical): No    Lack of Transportation (Non-Medical): No  Physical Activity: Not on file  Stress: No Stress Concern Present (06/23/2021)   [place]    Feeling of Stress : Not at all  Social Connections: Moderately Integrated (06/23/2021)   Social Connection and Isolation Panel [NHANES]    Frequency of Communication with Friends and Family: Once a week    Frequency of Social Gatherings with Friends and Family: More than three times a week    Attends Religious Services: 1 to 4 times per year    Active Member of [employer] or Organizations: No    Attends Archivist Meetings: Never    Marital Status: Married  Human resources officer Violence: Not At Risk (06/23/2021)    Humiliation, Afraid, Rape, and Kick questionnaire    Fear of Current or Ex-Partner: No    Emotionally Abused: No    Physically Abused: No    Sexually Abused: No    Family History  Problem Relation Age of Onset   Diabetes Maternal Grandmother    Bipolar disorder Maternal Grandmother     The following portions of the patient's history were reviewed and updated as appropriate: allergies, current medications, past OB history, past medical history, past surgical history, past family history, past social history, and problem list.  History obtained from the patient General ROS: negative for - chills, fatigue, or hot flashes Psychological ROS: positive for - anxiety Ophthalmic ROS: negative for - decreased vision ENT ROS: negative for - headaches or sore throat Hematological and Lymphatic ROS: negative for - bleeding problems, bruising, or swollen lymph nodes Endocrine ROS: negative for - breast changes, palpitations, or polydipsia/polyuria Breast ROS: negative for breast lumps Respiratory ROS: no cough, shortness of breath, or wheezing Cardiovascular ROS: no chest pain or dyspnea on exertion Gastrointestinal ROS: no abdominal pain, change in bowel habits, or black or bloody stools Genito-Urinary ROS: no dysuria, trouble voiding, or hematuria Musculoskeletal ROS: negative Dermatological ROS: negative   OBJECTIVE: Initial Physical Exam (New OB)  GENERAL APPEARANCE: alert, well appearing, in no apparent distress HEAD: normocephalic, atraumatic MOUTH: mucous membranes moist, pharynx normal without lesions THYROID: no thyromegaly or masses present BREASTS: patient declined exam LUNGS: clear to auscultation, no wheezes, rales or rhonchi, symmetric air entry HEART: regular rate and rhythm, no murmurs ABDOMEN: soft, nontender, nondistended, no abnormal masses, no epigastric pain, FHT present at 154, and fundus soft, non-tender, midway to umbilicus EXTREMITIES: no redness or tenderness in  the calves or thighs SKIN: normal coloration and turgor, no rashes LYMPH NODES: no adenopathy palpable NEUROLOGIC: alert, oriented, normal speech, no focal findings or movement disorder noted  PELVIC EXAM declined  ASSESSMENT: Normal pregnancy [redacted]w[redacted]d   PLAN: Routine prenatal care. We discussed an overview of prenatal care and when to call. Reviewed diet, exercise, and weight gain recommendations in pregnancy. Discussed benefits of breastfeeding and lactation resources at [facility]. I reviewed labs and answered all questions.  See orders  Lloyd Huger, CNM

## 2021-07-11 ENCOUNTER — Encounter: Payer: Self-pay | Admitting: Obstetrics

## 2021-07-21 ENCOUNTER — Encounter: Payer: Medicaid - Out of State | Admitting: Obstetrics

## 2021-07-24 ENCOUNTER — Telehealth: Payer: Self-pay | Admitting: Certified Nurse Midwife

## 2021-07-24 NOTE — Telephone Encounter (Signed)
Spoke to pt she is aware of new [place] apt

## 2021-08-16 ENCOUNTER — Ambulatory Visit (INDEPENDENT_AMBULATORY_CARE_PROVIDER_SITE_OTHER): Payer: Medicaid Other | Admitting: Certified Nurse Midwife

## 2021-08-16 ENCOUNTER — Other Ambulatory Visit: Payer: Medicaid Other

## 2021-08-16 VITALS — BP 109/71 | HR 98 | Wt 144.8 lb

## 2021-08-16 DIAGNOSIS — Z3A20 20 weeks gestation of pregnancy: Secondary | ICD-10-CM

## 2021-08-16 NOTE — Progress Notes (Signed)
ROB doing well, feeling movement. Has u/s scheduled for 08/21/21 , pt encouraged to keep appointment. Reviewed normal musculoskeletal discomforts of pregnancy and self help measures. Follow ROB in 4 wks.   Doreene Burke, CNM

## 2021-08-16 NOTE — Patient Instructions (Signed)
Round Ligament Pain  The round ligaments are a pair of cord-like tissues that help support the uterus. They can become a source of pain during pregnancy as the ligaments soften and stretch as the baby grows. The pain usually begins in the second trimester (13-28 weeks) of pregnancy, and should only last for a few seconds when it occurs. However, the pain can come and go until the baby is delivered. The pain does not cause harm to the baby. Round ligament pain is usually a short, sharp, and pinching pain, but it can also be a dull, lingering, and aching pain. The pain is felt in the lower side of the abdomen or in the groin. It usually starts deep in the groin and moves up to the outside of the hip area. The pain may happen when you: Suddenly change position, such as quickly going from a sitting to standing position. Do physical activity. Cough or sneeze. Follow these instructions at home: Managing pain  When the pain starts, relax. Then, try any of these methods to help with the pain: Sit down. Flex your knees up to your abdomen. Lie on your side with one pillow under your abdomen and another pillow between your legs. Sit in a warm bath for 15-20 minutes or until the pain goes away. General instructions Watch your condition for any changes. Move slowly when you sit down or stand up. Stop or reduce your physical activities if they cause pain. Avoid long walks if they cause pain. Take over-the-counter and prescription medicines only as told by your health care provider. Keep all follow-up visits. This is important. Contact a health care provider if: Your pain does not go away with treatment. You feel pain in your back that you did not have before. Your medicine is not helping. You have a fever or chills. You have nausea or vomiting. You have diarrhea. You have pain when you urinate. Get help right away if: You have pain that is a rhythmic, cramping pain similar to labor pains. Labor  pains are usually 2 minutes apart, last for about 1 minute, and involve a bearing down feeling or pressure in your pelvis. You have vaginal bleeding. These symptoms may represent a serious problem that is an emergency. Do not wait to see if the symptoms will go away. Get medical help right away. Call your local emergency services (911 in the [place] Do not drive yourself to the hospital. Summary Round ligament pain is felt in the lower abdomen or groin. This pain usually begins in the second trimester (13-28 weeks) and should only last for a few seconds when it occurs. You may notice the pain when you suddenly change position, when you cough or sneeze, or during physical activity. Relaxing, flexing your knees to your abdomen, lying on one side, or taking a warm bath may help to get rid of the pain. Contact your health care provider if the pain does not go away. This information is not intended to replace advice given to you by your health care provider. Make sure you discuss any questions you have with your health care provider. Document Revised: 03/30/2020 Document Reviewed: 03/30/2020 Elsevier Patient Education  2023 Elsevier Inc.  

## 2021-08-19 ENCOUNTER — Observation Stay
Admission: EM | Admit: 2021-08-19 | Discharge: 2021-08-19 | Disposition: A | Discharge status: Home / Self Care | Payer: Medicaid Other | Attending: Advanced Practice Midwife | Admitting: Advanced Practice Midwife

## 2021-08-19 ENCOUNTER — Encounter: Payer: Self-pay | Admitting: Obstetrics and Gynecology

## 2021-08-19 DIAGNOSIS — Y92009 Unspecified place in unspecified non-institutional (private) residence as the place of occurrence of the external cause: Secondary | ICD-10-CM | POA: Diagnosis not present

## 2021-08-19 DIAGNOSIS — Z3A20 20 weeks gestation of pregnancy: Secondary | ICD-10-CM

## 2021-08-19 DIAGNOSIS — O26892 Other specified pregnancy related conditions, second trimester: Principal | ICD-10-CM | POA: Insufficient documentation

## 2021-08-19 DIAGNOSIS — M549 Dorsalgia, unspecified: Secondary | ICD-10-CM | POA: Insufficient documentation

## 2021-08-19 DIAGNOSIS — W010XXA Fall on same level from slipping, tripping and stumbling without subsequent striking against object, initial encounter: Secondary | ICD-10-CM | POA: Insufficient documentation

## 2021-08-19 DIAGNOSIS — Z3480 Encounter for supervision of other normal pregnancy, unspecified trimester: Secondary | ICD-10-CM

## 2021-08-19 NOTE — OB Triage Note (Signed)
Patient here for a fall that happened around 730 am. She reports that she got out of bed quickly to tend to her other child and got her feet tangled. She fell on her belly and her right elbow. She has some back pain and felt like the "wind was knocked out of her". Denies bleeding and contractions reports positive fetal movement.

## 2021-08-19 NOTE — Discharge Summary (Signed)
Physician Final Progress Note  Patient ID: Crystal Melendez MRN: [id number] DOB/AGE: 2000-02-16 21 y.o.  Admit date: 08/19/2021 Admitting provider: Tresea Mall, CNM Discharge date: 08/19/2021   Admission Diagnoses:  1) intrauterine pregnancy at [redacted]w[redacted]d  2) fall at home  Discharge Diagnoses:  Principal Problem:   Labor and delivery, indication for care Active Problems:   [redacted] weeks gestation of pregnancy    History of Present Illness: The patient is a 21 y.o. female G3P2002 at [redacted]w[redacted]d who presents for a fall that happened this morning at 7:30 AM. She tripped and landed on her abdomen and her elbow. She reports good fetal movement. She denies vaginal bleeding, leakage of fluid or contractions.   She was admitted for observation and placed on tocometer, fetal heart tones by doppler. She was observed for 4 hours following the fall and discharged to home with instructions and precautions.   Past Medical History:  Diagnosis Date   ADHD    states when little; on no medication   Bipolar 1 disorder ([facility])    states dx'ed years ago; no medication    Past Surgical History:  Procedure Laterality Date   DENTAL SURGERY     all four age 26    No current facility-administered medications on file prior to encounter.   No current outpatient medications on file prior to encounter.    Allergies  Allergen Reactions   Sulfa Antibiotics Rash    Social History   Socioeconomic History   Marital status: Married    Spouse name: Not on file   Number of children: 2   Years of education: 12   Highest education level: Not on file  Occupational History   Occupation: Consulting civil engineer  Tobacco Use   Smoking status: Never   Smokeless tobacco: Never  Vaping Use   Vaping Use: Former   Substances: Nicotine   Devices: last use one month ago  Substance and Sexual Activity   Alcohol use: Not Currently    Comment: last use 4 years ago   Drug use: Not Currently    Frequency: 3.0 times per week    Types:  Marijuana    Comment: last use 4 years ago   Sexual activity: Yes    Birth control/protection: Pill    Comment: nexplanon removed 4-6 months ago; last used progestin only pill before current pregnancy  Other Topics Concern   Not on file  Social History Narrative   Not on file   Social Determinants of Health   Financial Resource Strain: Low Risk  (06/23/2021)   Overall Financial Resource Strain (CARDIA)    Difficulty of Paying Living Expenses: Not hard at all  Food Insecurity: No Food Insecurity (06/23/2021)   Hunger Vital Sign    Worried About Running Out of Food in the Last Year: Never true    Ran Out of Food in the Last Year: Never true  Transportation Needs: No Transportation Needs (06/23/2021)   PRAPARE - Administrator, Civil Service (Medical): No    Lack of Transportation (Non-Medical): No  Physical Activity: Not on file  Stress: No Stress Concern Present (06/23/2021)   [employer] of Occupational Health - Occupational Stress Questionnaire    Feeling of Stress : Not at all  Social Connections: Moderately Integrated (06/23/2021)   Social Connection and Isolation Panel [NHANES]    Frequency of Communication with Friends and Family: Once a week    Frequency of Social Gatherings with Friends and Family: More than three times a  week    Attends Religious Services: 1 to 4 times per year    Active Member of Clubs or Organizations: No    Attends Banker Meetings: Never    Marital Status: Married  Catering manager Violence: Not At Risk (06/23/2021)   Humiliation, Afraid, Rape, and Kick questionnaire    Fear of Current or Ex-Partner: No    Emotionally Abused: No    Physically Abused: No    Sexually Abused: No    Family History  Problem Relation Age of Onset   Diabetes Maternal Grandmother    Bipolar disorder Maternal Grandmother      Review of Systems  Constitutional:  Negative for chills and fever.  HENT:  Negative for congestion, ear discharge,  ear pain, hearing loss, sinus pain and sore throat.   Eyes:  Negative for blurred vision and double vision.  Respiratory:  Negative for cough, shortness of breath and wheezing.   Cardiovascular:  Negative for chest pain, palpitations and leg swelling.  Gastrointestinal:  Negative for abdominal pain, blood in stool, constipation, diarrhea, heartburn, melena, nausea and vomiting.  Genitourinary:  Negative for dysuria, flank pain, frequency, hematuria and urgency.  Musculoskeletal:  Negative for back pain, joint pain and myalgias.  Skin:  Negative for itching and rash.  Neurological:  Negative for dizziness, tingling, tremors, sensory change, speech change, focal weakness, seizures, loss of consciousness, weakness and headaches.  Endo/Heme/Allergies:  Negative for environmental allergies. Does not bruise/bleed easily.  Psychiatric/Behavioral:  Negative for depression, hallucinations, memory loss, substance abuse and suicidal ideas. The patient is not nervous/anxious and does not have insomnia.      Physical Exam: LMP 03/29/2021 (Approximate)   Constitutional: Well nourished, well developed female in no acute distress.  HEENT: normal Skin: Warm and dry.  Cardiovascular: Regular rate and rhythm.   Extremity:  no edema   Respiratory: Clear to auscultation bilateral. Normal respiratory effort Abdomen: FHT present 150s by doppler Back: no CVAT Neuro: DTRs 2+, Cranial nerves grossly intact Psych: Alert and Oriented x3. No memory deficits. Normal mood and affect.   Toco: negative Fetal well being: 150's  Consults: None  Significant Findings/ Diagnostic Studies: none  Procedures: none  Hospital Course: The patient was admitted to Labor and Delivery Triage for observation.   Discharge Condition: good  Disposition: Discharge disposition: 01-Home or Self Care  Diet: Regular diet  Discharge Activity: Activity as tolerated  Discharge Instructions     Discharge activity:  No  Restrictions   Complete by: As directed    Discharge diet:  No restrictions   Complete by: As directed       Allergies as of 08/19/2021       Reactions   Sulfa Antibiotics Rash        Medication List    You have not been prescribed any medications.     Follow-up Information     [facility]. Go to.   Why: scheduled prenatal appointment Contact information: [address]  [address] [place] [address] [phone]                Total time spent taking care of this patient: 18 minutes  Signed: Tresea Mall, CNM  08/19/2021, 12:26 PM

## 2021-08-19 NOTE — Progress Notes (Signed)
4 hours post fall, patient reports postive fetal movement, and denies contractions and bleeding. No contractions noted, FHT WNL discharged to home with precautions.

## 2021-08-20 ENCOUNTER — Encounter: Payer: Self-pay | Admitting: Emergency Medicine

## 2021-08-20 ENCOUNTER — Emergency Department
Admission: EM | Admit: 2021-08-20 | Discharge: 2021-08-21 | Disposition: A | Discharge status: Home / Self Care | Payer: Medicaid Other | Attending: Emergency Medicine | Admitting: Emergency Medicine

## 2021-08-20 ENCOUNTER — Other Ambulatory Visit: Payer: Self-pay

## 2021-08-20 DIAGNOSIS — O26892 Other specified pregnancy related conditions, second trimester: Secondary | ICD-10-CM | POA: Diagnosis present

## 2021-08-20 DIAGNOSIS — Z3A20 20 weeks gestation of pregnancy: Secondary | ICD-10-CM | POA: Diagnosis not present

## 2021-08-20 DIAGNOSIS — M546 Pain in thoracic spine: Secondary | ICD-10-CM | POA: Diagnosis not present

## 2021-08-20 DIAGNOSIS — R0789 Other chest pain: Secondary | ICD-10-CM | POA: Insufficient documentation

## 2021-08-20 DIAGNOSIS — Z3492 Encounter for supervision of normal pregnancy, unspecified, second trimester: Secondary | ICD-10-CM

## 2021-08-20 NOTE — ED Triage Notes (Signed)
Pt reports SHOB and bilateral back pain that is "achy" in nature. Pt is [redacted] weeks pregnant.

## 2021-08-20 NOTE — ED Provider Triage Note (Signed)
Emergency Medicine Provider Triage Evaluation Note  Crystal Melendez , a 21 y.o. female who is [redacted] weeks pregnant was evaluated in triage.  Pt complains of falling yesterday, patient states she tripped and fell forward landing on her chest, was evaluated yesterday by OB.  Today she has been having some intermittent right upper chest pain and left lower back pain, at times she has intermittent shortness of breath.  She gets relief with taking a hot shower.  She denies any lower extremity swelling, history of blood clots  Review of Systems  Positive: Chest and back pain, intermittent shortness of breath, [redacted] weeks pregnant Negative: Fever  Physical Exam  BP 120/80 (BP Location: Right Arm)   Pulse 98   Temp 97.9 F (36.6 C) (Oral)   Resp 17   LMP 03/29/2021 (Approximate)   SpO2 99%  Gen:   Awake, no distress   Resp:  Normal effort  MSK:   Moves extremities without difficulty  Other:    Medical Decision Making  Medically screening exam initiated at 5:51 PM.  Appropriate orders placed.  Crystal Melendez was informed that the remainder of the evaluation will be completed by another provider, this initial triage assessment does not replace that evaluation, and the importance of remaining in the ED until their evaluation is complete.     Evon Slack, [place] 08/20/21 1753

## 2021-08-21 ENCOUNTER — Encounter: Payer: Self-pay | Admitting: Certified Nurse Midwife

## 2021-08-21 ENCOUNTER — Ambulatory Visit
Admission: RE | Admit: 2021-08-21 | Discharge: 2021-08-21 | Disposition: A | Discharge status: Home / Self Care | Payer: Medicaid Other | Source: Ambulatory Visit | Attending: Obstetrics | Admitting: Obstetrics

## 2021-08-21 DIAGNOSIS — Z3A20 20 weeks gestation of pregnancy: Secondary | ICD-10-CM | POA: Diagnosis not present

## 2021-08-21 DIAGNOSIS — Z3687 Encounter for antenatal screening for uncertain dates: Secondary | ICD-10-CM | POA: Insufficient documentation

## 2021-08-21 DIAGNOSIS — Z3689 Encounter for other specified antenatal screening: Secondary | ICD-10-CM | POA: Diagnosis not present

## 2021-08-21 DIAGNOSIS — Z3482 Encounter for supervision of other normal pregnancy, second trimester: Secondary | ICD-10-CM | POA: Insufficient documentation

## 2021-08-21 NOTE — ED Provider Notes (Signed)
[facility] Provider Note    Event Date/Time   First MD Initiated Contact with Patient 08/21/21 0003     (approximate)   History   Chief Complaint: Shortness of Breath   HPI  Crystal Melendez is a 21 y.o. female with a past history of bipolar disorder, currently [redacted] weeks pregnant who comes ED complaining of achiness in her left mid back as well as in her central chest.  Nonradiating.  Not pleuritic.  Not exertional.  Worse with movement and turning of the thorax.  No vomiting or diaphoresis, no cough.  No fever  No abdominal pain, no vaginal bleeding or fluid leakage.     Physical Exam   Triage Vital Signs: ED Triage Vitals [08/20/21 1742]  Enc Vitals Group     BP 120/80     Pulse Rate 98     Resp 17     Temp 97.9 F (36.6 C)     Temp Source Oral     SpO2 99 %     Weight      Height      Head Circumference      Peak Flow      Pain Score      Pain Loc      Pain Edu?      Excl. in GC?     Most recent vital signs: Vitals:   08/20/21 2150 08/20/21 2358  BP: 112/81 98/64  Pulse: 98 80  Resp: 18 18  Temp: 99.4 F (37.4 C)   SpO2: 98% 97%    General: Awake, no distress.  CV:  Good peripheral perfusion.  Regular rate and rhythm Resp:  Normal effort.  Clear to auscultation bilaterally Abd:  No distention.  Gravid abdomen, fundus palpable at umbilicus.  Size consistent with dates. Other:  No lower extremity edema, no calf tenderness or swelling.  Anterior chest wall has parasternal tenderness to the touch which reproduces her achiness.  Also left mid back in the area of ninth intercostal space is tender to the touch reproducing her pain.  No CVA tenderness.   ED Results / Procedures / Treatments   Labs (all labs ordered are listed, but only abnormal results are displayed) Labs Reviewed - No data to display   EKG Interpreted by me Normal sinus rhythm rate of 91.  Normal axis intervals QRS ST segments and T  waves.   RADIOLOGY    PROCEDURES:  Procedures   MEDICATIONS ORDERED IN ED: Medications - No data to display   IMPRESSION / MDM / ASSESSMENT AND PLAN / ED COURSE  I reviewed the triage vital signs and the nursing notes.                               Patient presents with atypical chest discomfort which by exam is musculoskeletal chest wall pain.  Vitals are normal.  Patient is nontoxic and very well-appearing. Considering the patient's symptoms, medical history, and physical examination today, I have low suspicion for ACS, PE, TAD, pneumothorax, carditis, mediastinitis, pneumonia, CHF, or sepsis.  Recommend continued symptom monitoring, return precautions discussed, follow-up with primary care/[facility].       FINAL CLINICAL IMPRESSION(S) / ED DIAGNOSES   Final diagnoses:  Second trimester pregnancy  Chest wall pain     Rx / DC Orders   ED Discharge Orders     None  Note:  This document was prepared using Dragon voice recognition software and may include unintentional dictation errors.   Sharman Cheek, MD 08/21/21 [phone]

## 2021-08-23 ENCOUNTER — Ambulatory Visit (INDEPENDENT_AMBULATORY_CARE_PROVIDER_SITE_OTHER): Payer: Medicaid Other | Admitting: Certified Nurse Midwife

## 2021-08-23 ENCOUNTER — Encounter: Payer: Self-pay | Admitting: Certified Nurse Midwife

## 2021-08-23 ENCOUNTER — Telehealth: Payer: Self-pay | Admitting: Certified Nurse Midwife

## 2021-08-23 VITALS — BP 91/59 | Wt 144.0 lb

## 2021-08-23 DIAGNOSIS — R399 Unspecified symptoms and signs involving the genitourinary system: Secondary | ICD-10-CM

## 2021-08-23 DIAGNOSIS — Z3A21 21 weeks gestation of pregnancy: Secondary | ICD-10-CM

## 2021-08-23 LAB — POCT URINALYSIS DIPSTICK
Bilirubin, UA: NEGATIVE
Glucose, UA: NEGATIVE
Ketones, UA: NEGATIVE
Leukocytes, UA: NEGATIVE
Nitrite, UA: NEGATIVE
Protein, UA: POSITIVE — AB
Urobilinogen, UA: 0.2 E.U./dL
pH, UA: 5 (ref 5.0–8.0)

## 2021-08-23 NOTE — Patient Instructions (Signed)
Round Ligament Pain  The round ligaments are a pair of cord-like tissues that help support the uterus. They can become a source of pain during pregnancy as the ligaments soften and stretch as the baby grows. The pain usually begins in the second trimester (13-28 weeks) of pregnancy, and should only last for a few seconds when it occurs. However, the pain can come and go until the baby is delivered. The pain does not cause harm to the baby. Round ligament pain is usually a short, sharp, and pinching pain, but it can also be a dull, lingering, and aching pain. The pain is felt in the lower side of the abdomen or in the groin. It usually starts deep in the groin and moves up to the outside of the hip area. The pain may happen when you: Suddenly change position, such as quickly going from a sitting to standing position. Do physical activity. Cough or sneeze. Follow these instructions at home: Managing pain  When the pain starts, relax. Then, try any of these methods to help with the pain: Sit down. Flex your knees up to your abdomen. Lie on your side with one pillow under your abdomen and another pillow between your legs. Sit in a warm bath for 15-20 minutes or until the pain goes away. General instructions Watch your condition for any changes. Move slowly when you sit down or stand up. Stop or reduce your physical activities if they cause pain. Avoid long walks if they cause pain. Take over-the-counter and prescription medicines only as told by your health care provider. Keep all follow-up visits. This is important. Contact a health care provider if: Your pain does not go away with treatment. You feel pain in your back that you did not have before. Your medicine is not helping. You have a fever or chills. You have nausea or vomiting. You have diarrhea. You have pain when you urinate. Get help right away if: You have pain that is a rhythmic, cramping pain similar to labor pains. Labor  pains are usually 2 minutes apart, last for about 1 minute, and involve a bearing down feeling or pressure in your pelvis. You have vaginal bleeding. These symptoms may represent a serious problem that is an emergency. Do not wait to see if the symptoms will go away. Get medical help right away. Call your local emergency services (911 in the [place] Do not drive yourself to the hospital. Summary Round ligament pain is felt in the lower abdomen or groin. This pain usually begins in the second trimester (13-28 weeks) and should only last for a few seconds when it occurs. You may notice the pain when you suddenly change position, when you cough or sneeze, or during physical activity. Relaxing, flexing your knees to your abdomen, lying on one side, or taking a warm bath may help to get rid of the pain. Contact your health care provider if the pain does not go away. This information is not intended to replace advice given to you by your health care provider. Make sure you discuss any questions you have with your health care provider. Document Revised: 03/30/2020 Document Reviewed: 03/30/2020 Elsevier Patient Education  2023 Elsevier Inc.  

## 2021-08-23 NOTE — Progress Notes (Signed)
e

## 2021-08-23 NOTE — Progress Notes (Signed)
Crystal Melendez doing well, pt has lower back pain . Discussed self help measures. Urine culture send to r/o uti. Positive blood in urine dip today. FHT 150's,Fundus 21 cm .  u/s results reviewed. Discussed follow up due to mildly enlarged renel pelvis. Pt agrees. Orders placed. Will follow up with urine results. Crystal Melendez in 3 wks with Missy .   Doreene Burke, CNM   CLINICAL DATA:  Evaluate fetal age and anatomy   EXAM: OBSTETRICAL ULTRASOUND >14 WKS   COMPARISON:  04/24/2021   FINDINGS: Number of Fetuses: 1   Heart Rate:  157 bpm   Movement: Seen   Presentation: Transverse   Previa: No   Placental Location: Anterior   Amniotic Fluid (Subjective): Normal   Amniotic Fluid (Objective):   Vertical pocket = 5.5cm   FETAL BIOMETRY   BPD: 4.7cm 20w 2d   HC:   11.8cm 20w 2d   AC:   15.3cm 20w ford   FL:   3.3cm 20w 2d   Current Mean GA: 20w 3d [place] EDC: 01/05/2022   LMP assigned GA: 20w 5d Assigned EDC: 01/03/2022   Estimated Fetal Weight:  352.9+/- 52g 29.7%ile   FETAL ANATOMY   Lateral Ventricles: Appears normal   Thalami/CSP: Appears normal   Posterior Fossa:  Appears normal   Nuchal Region: Appears normal NFT=   Upper Lip: Appears normal   Spine: Appears normal   4 Chamber Heart on Left: Appears normal   LVOT: Appears normal   RVOT: Appears normal   Stomach on Left: Appears normal   3 Vessel Cord: Appears normal   Cord Insertion site: Appears normal   Kidneys: There is prominence of right renal pelvis measuring 4 mm in diameter without dilation of minor calices. Kidneys are otherwise unremarkable.   Bladder: Appears normal   Extremities: Appear normal   Sex: Female   Technically difficult due to: None   Maternal Findings:   Cervix:  Closed measuring 4.9 cm   IMPRESSION: Single live intrauterine pregnancy is seen. Sonographically estimated gestational age is 20 weeks 3 days.   Fetal right renal pelvis is prominent measuring 4 mm without dilation  of minor calyces. Follow-up limited OB sonogram in few weeks may be considered to re-evaluate this finding.     Electronically Signed   By: Ernie Avena M.D.   On: 08/21/2021 16:25

## 2021-08-23 NOTE — Telephone Encounter (Signed)
Patient called states she just received her results and would like someone to explain results to her. Informed patient that doctor receives results at the same time as patient and to allow 48 hours for a response to telephone messages. Please advise.

## 2021-08-23 NOTE — Addendum Note (Signed)
Addended by: Donnetta Hail on: 08/23/2021 11:05 AM   Modules accepted: Orders

## 2021-08-24 ENCOUNTER — Telehealth: Payer: Self-pay | Admitting: Certified Nurse Midwife

## 2021-08-24 NOTE — Telephone Encounter (Signed)
Pt called and states that she is going to proceed and take the medication prescribed to her by urgent care for pink eye. States her eye is really bothering her. Pt states that she spoke with a pharmacist and pharmacist informed patient that is was fine to take the medication.

## 2021-08-24 NOTE — Telephone Encounter (Signed)
Returned patient phone call. All questions answered.    Doreene Burke, CNM

## 2021-08-24 NOTE — Telephone Encounter (Signed)
Pt states: she went to Urgent Care to get treated for pink eye . Pt states the Dr sent in a RX for Azithromycin - Pt states she is unsure if the RX will interfere with a preexisting condition called: Perceptual cellulitis - Please return call

## 2021-08-25 LAB — URINE CULTURE

## 2021-08-25 NOTE — Telephone Encounter (Signed)
Sent mychart message in regards to patient concerns.

## 2021-08-28 ENCOUNTER — Ambulatory Visit: Payer: Medicaid Other

## 2021-09-09 ENCOUNTER — Ambulatory Visit
Admission: EM | Admit: 2021-09-09 | Discharge: 2021-09-09 | Disposition: A | Discharge status: Home / Self Care | Payer: Medicaid Other | Attending: Emergency Medicine | Admitting: Emergency Medicine

## 2021-09-09 DIAGNOSIS — H1032 Unspecified acute conjunctivitis, left eye: Secondary | ICD-10-CM | POA: Diagnosis not present

## 2021-09-09 DIAGNOSIS — J069 Acute upper respiratory infection, unspecified: Secondary | ICD-10-CM

## 2021-09-09 DIAGNOSIS — Z3A23 23 weeks gestation of pregnancy: Secondary | ICD-10-CM

## 2021-09-09 DIAGNOSIS — J029 Acute pharyngitis, unspecified: Secondary | ICD-10-CM

## 2021-09-09 LAB — POCT RAPID STREP A (OFFICE): Rapid Strep A Screen: NEGATIVE

## 2021-09-09 MED ORDER — POLYMYXIN B-TRIMETHOPRIM 10000-0.1 UNIT/ML-% OP SOLN: 1.0000 [drp] | Freq: Four times a day (QID) | OPHTHALMIC | 0 refills | Status: AC

## 2021-09-09 NOTE — Discharge Instructions (Addendum)
The strep test is negative.  Use the antibiotic eyedrops as prescribed.  Go to the emergency department if you have acute eye pain or changes in your vision or other concerning symptoms.  Take over-the-counter medications for your sore throat and upper respiratory symptoms only as directed by your OB/GYN.   Follow-up with your OB/GYN on Monday.

## 2021-09-09 NOTE — ED Triage Notes (Signed)
Patient to urgent care with complaints of dry cough/ congestion/ sore throat (specifically left side) x5 days. Has been taking mucinex and allergy pills without relief. Patient reports waking up this morning with left eye drainage and itching. Patient [redacted] weeks pregnant.

## 2021-09-09 NOTE — ED Provider Notes (Signed)
Crystal Melendez    CSN: [id number] Arrival date & time: 09/09/21  0804      History   Chief Complaint Chief Complaint  Patient presents with   Nasal Congestion    HPI Crystal Melendez is a 21 y.o. female.  Patient is [redacted] weeks pregnant.  She presents with 5-day history of sore throat, congestion, nonproductive cough.  She also reports yellow-green drainage, itching, redness of her left eye since this morning.  She denies fever, chills, rash, shortness of breath, chest pain, abdominal pain, or other symptoms.  Treatment at home with Zyrtec and guaifenesin.  Patient was seen at [facility] clinic on 08/24/2021; diagnosed with preseptal cellulitis of right eye and sore throat; treated with Augmentin.  Her medical history includes ADHD, anxiety, bipolar disorder, suicidal ideation, depression, mood disorder.    The history is provided by the patient and medical records.    Past Medical History:  Diagnosis Date   ADHD    states when little; on no medication   Bipolar 1 disorder (HCC)    states dx'ed years ago; no medication    Patient Active Problem List   Diagnosis Date Noted   [redacted] weeks gestation of pregnancy 08/19/2021   Supervision of other normal pregnancy, antepartum 06/23/2021   Adjustment disorder with depressed mood    MDD (major depressive disorder), recurrent episode, severe (HCC) 05/14/2017   Bipolar 2 disorder (HCC) 04/24/2017    Past Surgical History:  Procedure Laterality Date   DENTAL SURGERY     all four age 61    OB History     Gravida  3   Para  2   Term  2   Preterm  0   AB  0   Living  2      SAB  0   IAB  0   Ectopic  0   Multiple  0   Live Births  2            Home Medications    Prior to Admission medications   Medication Sig Start Date End Date Taking? Authorizing Provider  trimethoprim-polymyxin b (POLYTRIM) ophthalmic solution Place 1 drop into both eyes 4 (four) times daily for 7 days. 09/09/21 09/16/21 Yes Mickie Bail, NP    Family History Family History  Problem Relation Age of Onset   Diabetes Maternal Grandmother    Bipolar disorder Maternal Grandmother     Social History Social History   Tobacco Use   Smoking status: Never   Smokeless tobacco: Never  Vaping Use   Vaping Use: Former   Substances: Nicotine   Devices: last use one month ago  Substance Use Topics   Alcohol use: Not Currently    Comment: last use 4 years ago   Drug use: Not Currently    Frequency: 3.0 times per week    Types: Marijuana    Comment: last use 4 years ago     Allergies   Sulfa antibiotics   Review of Systems Review of Systems  Constitutional:  Negative for chills and fever.  HENT:  Positive for congestion and sore throat. Negative for ear pain.   Eyes:  Positive for discharge, redness and itching. Negative for pain and visual disturbance.  Respiratory:  Positive for cough. Negative for shortness of breath.   Cardiovascular:  Negative for chest pain and palpitations.  Gastrointestinal:  Negative for abdominal pain, diarrhea and vomiting.  Skin:  Negative for color change and rash.  All  other systems reviewed and are negative.    Physical Exam Triage Vital Signs ED Triage Vitals  Enc Vitals Group     BP      Pulse      Resp      Temp      Temp src      SpO2      Weight      Height      Head Circumference      Peak Flow      Pain Score      Pain Loc      Pain Edu?      Excl. in GC?    No data found.  Updated Vital Signs BP 111/78   Pulse (!) 104   Temp 98.4 F (36.9 C)   Resp 18   Ht 5\' 1"  (1.549 m)   Wt 147 lb (66.7 kg)   LMP 03/29/2021 (Approximate)   SpO2 98%   BMI 27.78 kg/m   Visual Acuity Right Eye Distance:   Left Eye Distance:   Bilateral Distance:    Right Eye Near:   Left Eye Near:    Bilateral Near:     Physical Exam Vitals and nursing note reviewed.  Constitutional:      General: She is not in acute distress.    Appearance: She is  well-developed. She is not ill-appearing.  HENT:     Right Ear: Tympanic membrane normal.     Left Ear: Tympanic membrane normal.     Nose: Nose normal.     Mouth/Throat:     Mouth: Mucous membranes are moist.     Pharynx: Oropharynx is clear.  Eyes:     General: Lids are normal. Vision grossly intact.        Right eye: No discharge.        Left eye: Discharge present.    Extraocular Movements: Extraocular movements intact.     Conjunctiva/sclera:     Left eye: Left conjunctiva is injected.     Pupils: Pupils are equal, round, and reactive to light.     Comments: Scant thick yellow-green drainage in left eye.  Cardiovascular:     Rate and Rhythm: Normal rate and regular rhythm.     Heart sounds: Normal heart sounds.  Pulmonary:     Effort: Pulmonary effort is normal. No respiratory distress.     Breath sounds: Normal breath sounds.  Musculoskeletal:     Cervical back: Neck supple.  Skin:    General: Skin is warm and dry.  Neurological:     Mental Status: She is alert.  Psychiatric:        Mood and Affect: Mood normal.        Behavior: Behavior normal.      UC Treatments / Results  Labs (all labs ordered are listed, but only abnormal results are displayed) Labs Reviewed - No data to display  EKG   Radiology No results found.  Procedures Procedures (including critical care time)  Medications Ordered in UC Medications - No data to display  Initial Impression / Assessment and Plan / UC Course  I have reviewed the triage vital signs and the nursing notes.  Pertinent labs & imaging results that were available during my care of the patient were reviewed by me and considered in my medical decision making (see chart for details).   [redacted] weeks pregnant, bacterial conjunctivitis of left eye, viral URI, sore throat.  Rapid strep negative.  Instructed patient to  continue symptomatic treatment as directed by her OB/GYN.  Treating conjunctivitis with Polytrim eyedrops.  ED  precautions discussed.  Instructed patient to follow-up with her OB/GYN on Monday.  Education provided on viral respiratory infection and bacterial conjunctivitis.  Patient agrees to plan of care.   Final Clinical Impressions(s) / UC Diagnoses   Final diagnoses:  [redacted] weeks gestation of pregnancy  Acute bacterial conjunctivitis of left eye  Viral URI  Sore throat     Discharge Instructions      The strep test is negative.  Use the antibiotic eyedrops as prescribed.  Go to the emergency department if you have acute eye pain or changes in your vision or other concerning symptoms.  Take over-the-counter medications for your sore throat and upper respiratory symptoms only as directed by your OB/GYN.   Follow-up with your OB/GYN on Monday.      ED Prescriptions     Medication Sig Dispense Auth. Provider   trimethoprim-polymyxin b (POLYTRIM) ophthalmic solution Place 1 drop into both eyes 4 (four) times daily for 7 days. 10 mL Mickie Bail, NP      PDMP not reviewed this encounter.   Mickie Bail, NP 09/09/21 [phone]

## 2021-09-11 ENCOUNTER — Ambulatory Visit (INDEPENDENT_AMBULATORY_CARE_PROVIDER_SITE_OTHER): Payer: Medicaid Other | Admitting: Obstetrics

## 2021-09-11 ENCOUNTER — Ambulatory Visit (INDEPENDENT_AMBULATORY_CARE_PROVIDER_SITE_OTHER): Payer: Medicaid Other

## 2021-09-11 ENCOUNTER — Encounter: Payer: Self-pay | Admitting: Obstetrics

## 2021-09-11 VITALS — BP 115/77 | HR 112 | Wt 146.9 lb

## 2021-09-11 DIAGNOSIS — Z3A21 21 weeks gestation of pregnancy: Secondary | ICD-10-CM | POA: Diagnosis not present

## 2021-09-11 DIAGNOSIS — Z3A23 23 weeks gestation of pregnancy: Secondary | ICD-10-CM

## 2021-09-11 LAB — POCT URINALYSIS DIPSTICK OB
Bilirubin, UA: NEGATIVE
Blood, UA: NEGATIVE
Glucose, UA: NEGATIVE
Ketones, UA: NEGATIVE
Leukocytes, UA: NEGATIVE
Nitrite, UA: NEGATIVE
POC,PROTEIN,UA: NEGATIVE
Spec Grav, UA: 1.02 (ref 1.010–1.025)
Urobilinogen, UA: 0.2 E.U./dL
pH, UA: 6 (ref 5.0–8.0)

## 2021-09-11 MED ORDER — HYDROXYZINE HCL 10 MG PO TABS: 10.0000 mg | ORAL_TABLET | Freq: Three times a day (TID) | ORAL | 2 refills | Status: DC | PRN

## 2021-09-11 NOTE — Progress Notes (Signed)
Crystal Melendez at [redacted]w[redacted]d. Baby is active. Denies ctx, LOF, vaginal bleeding. Crystal Melendez is feeling a lot of increased anxiety and would like to start medication. She prefers a PRN medication instead of daily SSRI at this time, but will consider that if she gets no improvement. She would like to try hydroxyzine. Reviewed [place] from today. Still showing pyelectasis. Recommendation for f/u US after 28 weeks. Discussed 1-hour glucose, CBC, RPR at next visit. RTC in 4 weeks.    Crystal Melendez Spanish, CNM

## 2021-09-14 ENCOUNTER — Encounter: Payer: Self-pay | Admitting: *Deleted

## 2021-09-14 ENCOUNTER — Emergency Department
Admission: EM | Admit: 2021-09-14 | Discharge: 2021-09-14 | Disposition: A | Discharge status: Home / Self Care | Payer: Medicaid Other | Attending: Emergency Medicine | Admitting: Emergency Medicine

## 2021-09-14 ENCOUNTER — Other Ambulatory Visit: Payer: Self-pay

## 2021-09-14 DIAGNOSIS — O99712 Diseases of the skin and subcutaneous tissue complicating pregnancy, second trimester: Secondary | ICD-10-CM | POA: Insufficient documentation

## 2021-09-14 DIAGNOSIS — L299 Pruritus, unspecified: Secondary | ICD-10-CM | POA: Insufficient documentation

## 2021-09-14 DIAGNOSIS — O26892 Other specified pregnancy related conditions, second trimester: Secondary | ICD-10-CM | POA: Diagnosis present

## 2021-09-14 DIAGNOSIS — Z3A24 24 weeks gestation of pregnancy: Secondary | ICD-10-CM | POA: Insufficient documentation

## 2021-09-14 LAB — COMPREHENSIVE METABOLIC PANEL
ALT: 16 U/L (ref 0–44)
AST: 19 U/L (ref 15–41)
Albumin: 3.4 g/dL — ABNORMAL LOW (ref 3.5–5.0)
Alkaline Phosphatase: 57 U/L (ref 38–126)
Anion gap: 10 (ref 5–15)
BUN: 8 mg/dL (ref 6–20)
CO2: 23 mmol/L (ref 22–32)
Calcium: 9 mg/dL (ref 8.9–10.3)
Chloride: 106 mmol/L (ref 98–111)
Creatinine, Ser: 0.58 mg/dL (ref 0.44–1.00)
GFR, Estimated: >60 mL/min (ref >60)
Glucose, Bld: 99 mg/dL (ref 70–99)
Potassium: 3.4 mmol/L — ABNORMAL LOW (ref 3.5–5.1)
Sodium: 139 mmol/L (ref 135–145)
Total Bilirubin: 0.5 mg/dL (ref 0.3–1.2)
Total Protein: 6.8 g/dL (ref 6.5–8.1)

## 2021-09-14 LAB — CBC WITH DIFFERENTIAL/PLATELET
Abs Immature Granulocytes: 0.04 10*3/uL (ref 0.00–0.07)
Basophils Absolute: 0 10*3/uL (ref 0.0–0.1)
Basophils Relative: 1 %
Eosinophils Absolute: 0.1 10*3/uL (ref 0.0–0.5)
Eosinophils Relative: 2 %
HCT: 36.5 % (ref 36.0–46.0)
Hemoglobin: 12.5 g/dL (ref 12.0–15.0)
Immature Granulocytes: 1 %
Lymphocytes Relative: 38 %
Lymphs Abs: 3.1 10*3/uL (ref 0.7–4.0)
MCH: 31.1 pg (ref 26.0–34.0)
MCHC: 34.2 g/dL (ref 30.0–36.0)
MCV: 90.8 fL (ref 80.0–100.0)
Monocytes Absolute: 0.5 10*3/uL (ref 0.1–1.0)
Monocytes Relative: 7 %
Neutro Abs: 4.2 10*3/uL (ref 1.7–7.7)
Neutrophils Relative %: 51 %
Platelets: 254 10*3/uL (ref 150–400)
RBC: 4.02 MIL/uL (ref 3.87–5.11)
RDW: 11.6 % (ref 11.5–15.5)
WBC: 8 10*3/uL (ref 4.0–10.5)
nRBC: 0 % (ref 0.0–0.2)

## 2021-09-14 LAB — URINALYSIS, ROUTINE W REFLEX MICROSCOPIC
Bilirubin Urine: NEGATIVE
Glucose, UA: NEGATIVE mg/dL
Hgb urine dipstick: NEGATIVE
Ketones, ur: NEGATIVE mg/dL
Leukocytes,Ua: NEGATIVE
Nitrite: NEGATIVE
Protein, ur: NEGATIVE mg/dL
Specific Gravity, Urine: 1.019 (ref 1.005–1.030)
pH: 6 (ref 5.0–8.0)

## 2021-09-14 LAB — LIPASE, BLOOD: Lipase: 32 U/L (ref 11–51)

## 2021-09-14 NOTE — ED Provider Notes (Signed)
[facility] Provider Note  Patient Contact: 8:54 PM (approximate)   History   Pruritis   HPI  Crystal Melendez is a 21 y.o. female presents to the emergency department with pruritus.  Patient was referred to the emergency department to rule out hyperbilirubinemia.  Patient denies known insect bites or stings.  No new exposures to laundry detergents, soaps or other outdoor exposures.  Patient states that she has been using hydrocortisone cream but no other medications.  She denies any pelvic pain, vaginal bleeding or changes in fetal movement.  Patient is approximately [redacted] weeks pregnant.      Physical Exam   Triage Vital Signs: ED Triage Vitals [09/14/21 2033]  Enc Vitals Group     BP 112/66     Pulse Rate (!) 106     Resp 20     Temp 98.3 F (36.8 C)     Temp Source Oral     SpO2 98 %     Weight 146 lb (66.2 kg)     Height 5\' 1"  (1.549 m)     Head Circumference      Peak Flow      Pain Score 0     Pain Loc      Pain Edu?      Excl. in GC?     Most recent vital signs: Vitals:   09/14/21 2033  BP: 112/66  Pulse: (!) 106  Resp: 20  Temp: 98.3 F (36.8 C)  SpO2: 98%     General: Alert and in no acute distress. Eyes:  PERRL. EOMI. Head: No acute traumatic findings ENT:      Nose: No congestion/rhinnorhea.      Mouth/Throat: Mucous membranes are moist. Neck: No stridor. No cervical spine tenderness to palpation. Cardiovascular:  Good peripheral perfusion Respiratory: Normal respiratory effort without tachypnea or retractions. Lungs CTAB. Good air entry to the bases with no decreased or absent breath sounds. Gastrointestinal: Bowel sounds 4 quadrants. Soft and nontender to palpation. No guarding or rigidity. No palpable masses. No distention. No CVA tenderness. Musculoskeletal: Full range of motion to all extremities.  Neurologic:  No gross focal neurologic deficits are appreciated.  Skin:   No rash noted Other:   ED Results /  Procedures / Treatments   Labs (all labs ordered are listed, but only abnormal results are displayed) Labs Reviewed  COMPREHENSIVE METABOLIC PANEL - Abnormal; Notable for the following components:      Result Value   Potassium 3.4 (*)    Albumin 3.4 (*)    All other components within normal limits  URINALYSIS, ROUTINE W REFLEX MICROSCOPIC - Abnormal; Notable for the following components:   Color, Urine YELLOW (*)    APPearance HAZY (*)    Bacteria, UA RARE (*)    All other components within normal limits  CBC WITH DIFFERENTIAL/PLATELET  LIPASE, BLOOD           PROCEDURES:  Critical Care performed: No  Procedures   MEDICATIONS ORDERED IN ED: Medications - No data to display   IMPRESSION / MDM / ASSESSMENT AND PLAN / ED COURSE  I reviewed the triage vital signs and the nursing notes.                              Assessment and plan Pruritus 21 year old female presents to the emergency department to rule out hyperbilirubinemia as a source of pruritus.  Patient was  mildly tachycardic but vital signs were otherwise reassuring.  Patient was alert and nontoxic-appearing.  CBC, CMP, lipase and urinalysis in process.  CBC and CMP reassuring.  Urinalysis shows no signs of UTI.  Fetal heart tones assessed by myself at 160 bpm.      FINAL CLINICAL IMPRESSION(S) / ED DIAGNOSES   Final diagnoses:  Pruritus     Rx / DC Orders   ED Discharge Orders     None        Note:  This document was prepared using Dragon voice recognition software and may include unintentional dictation errors.   Pia Mau Grandin, Cordelia Poche 09/14/21 2212    Minna Antis, MD 09/15/21 2241

## 2021-09-14 NOTE — ED Triage Notes (Signed)
Pt reports itching all over since yesterday.  Pt is [redacted] weeks pregnant.  No rash on body.   No otc meds   Pt alert  speech clear.

## 2021-09-23 ENCOUNTER — Observation Stay
Admission: EM | Admit: 2021-09-23 | Discharge: 2021-09-23 | Disposition: A | Discharge status: Home / Self Care | Payer: Medicaid Other | Attending: Advanced Practice Midwife | Admitting: Advanced Practice Midwife

## 2021-09-23 ENCOUNTER — Encounter: Payer: Self-pay | Admitting: Obstetrics and Gynecology

## 2021-09-23 ENCOUNTER — Other Ambulatory Visit: Payer: Self-pay

## 2021-09-23 DIAGNOSIS — Z3A20 20 weeks gestation of pregnancy: Secondary | ICD-10-CM

## 2021-09-23 DIAGNOSIS — M549 Dorsalgia, unspecified: Secondary | ICD-10-CM | POA: Diagnosis not present

## 2021-09-23 DIAGNOSIS — Z3A25 25 weeks gestation of pregnancy: Secondary | ICD-10-CM

## 2021-09-23 DIAGNOSIS — O26892 Other specified pregnancy related conditions, second trimester: Principal | ICD-10-CM | POA: Insufficient documentation

## 2021-09-23 DIAGNOSIS — Z3480 Encounter for supervision of other normal pregnancy, unspecified trimester: Principal | ICD-10-CM

## 2021-09-23 DIAGNOSIS — O99891 Other specified diseases and conditions complicating pregnancy: Secondary | ICD-10-CM | POA: Diagnosis not present

## 2021-09-23 DIAGNOSIS — R109 Unspecified abdominal pain: Secondary | ICD-10-CM | POA: Insufficient documentation

## 2021-09-23 DIAGNOSIS — M545 Low back pain, unspecified: Secondary | ICD-10-CM | POA: Diagnosis not present

## 2021-09-23 DIAGNOSIS — R3915 Urgency of urination: Secondary | ICD-10-CM | POA: Diagnosis not present

## 2021-09-23 LAB — URINALYSIS, COMPLETE (UACMP) WITH MICROSCOPIC
Bilirubin Urine: NEGATIVE
Glucose, UA: NEGATIVE mg/dL
Hgb urine dipstick: NEGATIVE
Ketones, ur: NEGATIVE mg/dL
Leukocytes,Ua: NEGATIVE
Nitrite: NEGATIVE
Protein, ur: NEGATIVE mg/dL
Specific Gravity, Urine: 1.004 — ABNORMAL LOW (ref 1.005–1.030)
pH: 7 (ref 5.0–8.0)

## 2021-09-23 NOTE — OB Triage Note (Signed)
Discharge instructions, follow-up care and appointments, and labor precautions reviewed with patient. Patient verbalized understanding and voiced no questions. Patient ambulated off unit with family member.

## 2021-09-23 NOTE — OB Triage Note (Signed)
Patient is a G3 P2002, 25 wks 3 days coming in for lower back and abdominal pain. Patient reported initial shooting like pain began when she woke up this morning, and was not relieved by Tylenol 500mg  x2 or rest. Patient noted 6/10 pain to begin in lower back and radiate to her abdomen. Patient also noted urinary urgency. Patient states +FM, denies leakage of fluid or bleeding, and reports no sex within the past 2-3 months. Toco and EFM placed at 1516. Initial FHT noted to be 150bpm at 1521. Patient vital signs stable at this time.

## 2021-09-23 NOTE — Discharge Summary (Cosign Needed Addendum)
Physician Final Progress Note  Patient ID: Crystal Melendez MRN: [id number] DOB/AGE: 2000-08-05 21 y.o.  Admit date: 09/23/2021 Admitting provider: Tresea Mall, CNM Discharge date: 09/23/2021   Admission Diagnoses:  1) intrauterine pregnancy at [redacted]w[redacted]d  2) back pain 3) abdominal pain  Discharge Diagnoses:  Principal Problem:   Back pain affecting pregnancy    History of Present Illness: The patient is a 21 y.o. female G3P2002 at [redacted]w[redacted]d who presents for low back pain radiating to her abdomen that began this morning. She feels cramping pain irregularly between every 15 to 60 minutes lasting less than a minute. She tried tylenol and shower/heat without relief. She denies trauma. She admits normally staying well hydrated. She does report some urinary urgency and denies burning or frequency. She denies contractions/tight feeling, vaginal bleeding or leakage of fluid. She reports good fetal movement.   She was admitted for observation, placed on monitors, lab collected. Monitoring is reassuring, lab is not suggestive of UTI- will order culture to be sure. She is discharged to home with instructions and precautions.   Past Medical History:  Diagnosis Date   ADHD    states when little; on no medication   Bipolar 1 disorder (HCC)    states dx'ed years ago; no medication    Past Surgical History:  Procedure Laterality Date   DENTAL SURGERY     all four age 72    No current facility-administered medications on file prior to encounter.   Current Outpatient Medications on File Prior to Encounter  Medication Sig Dispense Refill   hydrOXYzine (ATARAX) 10 MG tablet Take 1 tablet (10 mg total) by mouth 3 (three) times daily as needed. 30 tablet 2    Allergies  Allergen Reactions   Sulfa Antibiotics Rash    Social History   Socioeconomic History   Marital status: Married    Spouse name: Not on file   Number of children: 2   Years of education: 12   Highest education level: Not on  file  Occupational History   Occupation: Consulting civil engineer  Tobacco Use   Smoking status: Never   Smokeless tobacco: Never  Vaping Use   Vaping Use: Former   Substances: Nicotine   Devices: last use one month ago  Substance and Sexual Activity   Alcohol use: Not Currently    Comment: last use 4 years ago   Drug use: Not Currently    Frequency: 3.0 times per week    Types: Marijuana    Comment: last use 4 years ago   Sexual activity: Yes    Birth control/protection: Pill    Comment: nexplanon removed 4-6 months ago; last used progestin only pill before current pregnancy  Other Topics Concern   Not on file  Social History Narrative   Not on file   Social Determinants of Health   Financial Resource Strain: Low Risk  (06/23/2021)   Overall Financial Resource Strain (CARDIA)    Difficulty of Paying Living Expenses: Not hard at all  Food Insecurity: No Food Insecurity (06/23/2021)   Hunger Vital Sign    Worried About Running Out of Food in the Last Year: Never true    Ran Out of Food in the Last Year: Never true  Transportation Needs: No Transportation Needs (06/23/2021)   PRAPARE - Administrator, Civil Service (Medical): No    Lack of Transportation (Non-Medical): No  Physical Activity: Not on file  Stress: No Stress Concern Present (06/23/2021)   [employer] of Occupational  Health - Occupational Stress Questionnaire    Feeling of Stress : Not at all  Social Connections: Moderately Integrated (06/23/2021)   Social Connection and Isolation Panel [NHANES]    Frequency of Communication with Friends and Family: Once a week    Frequency of Social Gatherings with Friends and Family: More than three times a week    Attends Religious Services: 1 to 4 times per year    Active Member of [employer] or Organizations: No    Attends Archivist Meetings: Never    Marital Status: Married  Human resources officer Violence: Not At Risk (06/23/2021)   Humiliation, Afraid, Rape, and Kick  questionnaire    Fear of Current or Ex-Partner: No    Emotionally Abused: No    Physically Abused: No    Sexually Abused: No    Family History  Problem Relation Age of Onset   Diabetes Maternal Grandmother    Bipolar disorder Maternal Grandmother      Review of Systems  Constitutional:  Negative for chills and fever.  HENT:  Negative for congestion, ear discharge, ear pain, hearing loss, sinus pain and sore throat.   Eyes:  Negative for blurred vision and double vision.  Respiratory:  Negative for cough, shortness of breath and wheezing.   Cardiovascular:  Negative for chest pain, palpitations and leg swelling.  Gastrointestinal:  Positive for abdominal pain. Negative for blood in stool, constipation, diarrhea, heartburn, melena, nausea and vomiting.  Genitourinary:  Positive for urgency. Negative for dysuria, flank pain, frequency and hematuria.  Musculoskeletal:  Positive for back pain. Negative for joint pain and myalgias.  Skin:  Negative for itching and rash.  Neurological:  Negative for dizziness, tingling, tremors, sensory change, speech change, focal weakness, seizures, loss of consciousness, weakness and headaches.  Endo/Heme/Allergies:  Negative for environmental allergies. Does not bruise/bleed easily.  Psychiatric/Behavioral:  Negative for depression, hallucinations, memory loss, substance abuse and suicidal ideas. The patient is not nervous/anxious and does not have insomnia.      Physical Exam: BP (!) 103/57 (BP Location: Right Arm)   Pulse (!) 101   Temp 98.3 F (36.8 C) (Oral)   Ht 5\' 1"  (1.549 m)   Wt 68 kg   LMP 03/29/2021 (Approximate)   BMI 28.34 kg/m   Constitutional: Well nourished, well developed female in no acute distress.  HEENT: normal Skin: Warm and dry.  Cardiovascular: Regular rate and rhythm.   Extremity:  no edema   Respiratory: Clear to auscultation bilateral. Normal respiratory effort Abdomen: FHT present and mild to palpation Back: no  CVAT Neuro: DTRs 2+, Cranial nerves grossly intact Psych: Alert and Oriented x3. No memory deficits. Normal mood and affect.   Toco: negative for contractions Fetal well being: 145 bpm, moderate variability, + 10x10 accelerations, -decelerations  Consults: None  Significant Findings/ Diagnostic Studies: labs:   Latest Reference Range & Units 09/23/21 16:11  Appearance CLEAR  CLEAR !  Bilirubin Urine NEGATIVE  NEGATIVE  Color, Urine YELLOW  STRAW !  Glucose, UA NEGATIVE mg/dL NEGATIVE  Hgb urine dipstick NEGATIVE  NEGATIVE  Ketones, ur NEGATIVE mg/dL NEGATIVE  Leukocytes,Ua NEGATIVE  NEGATIVE  Nitrite NEGATIVE  NEGATIVE  pH 5.0 - 8.0  7.0  Protein NEGATIVE mg/dL NEGATIVE  Specific Gravity, Urine 1.005 - 1.030  1.004 (L)  Bacteria, UA NONE SEEN  RARE !  Mucus  PRESENT  RBC / HPF 0 - 5 RBC/hpf 0-5  Squamous Epithelial / LPF 0 - 5  0-5  WBC, UA 0 -  5 WBC/hpf 0-5  !: Data is abnormal (L): Data is abnormally low  Procedures: NST  Hospital Course: The patient was admitted to Labor and Delivery Triage for observation.   Discharge Condition: good  Disposition: Discharge disposition: 01-Home or Self Care  Diet: Regular diet  Discharge Activity: Activity as tolerated  Discharge Instructions     Discharge activity:  No Restrictions   Complete by: As directed    Stretches/exercises, heat/ice, topical pain creams/patches, abdominal support band, epsom salt soaks   Discharge diet:  No restrictions   Complete by: As directed    Stay well hydrated      Allergies as of 09/23/2021       Reactions   Sulfa Antibiotics Rash        Medication List     TAKE these medications    hydrOXYzine 10 MG tablet Commonly known as: ATARAX Take 1 tablet (10 mg total) by mouth 3 (three) times daily as needed.         Total time spent taking care of this patient: 20 minutes  Signed: Tresea Mall, CNM  09/23/2021, 4:53 PM

## 2021-09-25 LAB — URINE CULTURE

## 2021-09-28 ENCOUNTER — Encounter: Payer: Self-pay | Admitting: Emergency Medicine

## 2021-09-28 ENCOUNTER — Other Ambulatory Visit: Payer: Self-pay

## 2021-09-28 DIAGNOSIS — L509 Urticaria, unspecified: Secondary | ICD-10-CM | POA: Insufficient documentation

## 2021-09-28 DIAGNOSIS — Z3A26 26 weeks gestation of pregnancy: Secondary | ICD-10-CM | POA: Insufficient documentation

## 2021-09-28 DIAGNOSIS — O26892 Other specified pregnancy related conditions, second trimester: Secondary | ICD-10-CM | POA: Diagnosis not present

## 2021-09-28 MED ORDER — PREDNISONE 20 MG PO TABS
60.0000 mg | ORAL_TABLET | Freq: Once | ORAL | Status: AC
  Administered 2021-09-29: 60 mg via ORAL
  Filled 2021-09-28: qty 3

## 2021-09-28 MED ORDER — FAMOTIDINE 20 MG PO TABS
20.0000 mg | ORAL_TABLET | Freq: Once | ORAL | Status: AC
  Administered 2021-09-29: 20 mg via ORAL
  Filled 2021-09-28: qty 1

## 2021-09-28 NOTE — ED Triage Notes (Signed)
Patient ambulatory to triage with steady gait, without difficulty or distress noted; pt reports intermittent "whelps" beneath eyes this evening; none at present; unknown cause or hx of same

## 2021-09-29 ENCOUNTER — Emergency Department
Admission: EM | Admit: 2021-09-29 | Discharge: 2021-09-29 | Disposition: A | Discharge status: Home / Self Care | Payer: Medicaid Other | Attending: Emergency Medicine | Admitting: Emergency Medicine

## 2021-09-29 DIAGNOSIS — L509 Urticaria, unspecified: Secondary | ICD-10-CM

## 2021-09-29 NOTE — ED Provider Notes (Signed)
[facility] Provider Note    Event Date/Time   First MD Initiated Contact with Patient 09/29/21 0203     (approximate)   History   Allergic Reaction   HPI  Crystal Melendez is a 21 y.o. female who presents to the ED for evaluation of Allergic Reaction   Patient is a G3 P2 at about [redacted] weeks gestation.  She presents to the ED for evaluation of possible allergic reaction due to scattered and intermittent pruritic spots on her face.  She reports 1 spot beneath her right eyelid on her maxillary cheek, resolved and then followed by a similar lesion on her left side.  She uses me a picture on her phone of each of these lesions that appear to be hives.  She reports this self resolved and she feels better now.  No shortness of breath, abdominal pain, emesis, throat closure sensation or difficulty breathing.   Physical Exam   Triage Vital Signs: ED Triage Vitals  Enc Vitals Group     BP 09/28/21 2148 108/67     Pulse Rate 09/28/21 2148 87     Resp 09/28/21 2148 20     Temp 09/28/21 2148 98.4 F (36.9 C)     Temp Source 09/28/21 2148 Oral     SpO2 09/28/21 2148 99 %     Weight 09/28/21 2147 150 lb (68 kg)     Height 09/28/21 2147 5\' 1"  (1.549 m)     Head Circumference --      Peak Flow --      Pain Score 09/28/21 2146 0     Pain Loc --      Pain Edu? --      Excl. in GC? --     Most recent vital signs: Vitals:   09/28/21 2148 09/29/21 0251  BP: 108/67 131/61  Pulse: 87 (!) 18  Resp: 20 18  Temp: 98.4 F (36.9 C) 98 F (36.7 C)  SpO2: 99% 100%    General: Awake, no distress.  Systemically well. CV:  Good peripheral perfusion.  Resp:  Normal effort.  Clear lungs Abd:  No distention.  Soft, gravid and benign MSK:  No deformity noted.  Neuro:  No focal deficits appreciated. Other:  No hives or rashes noted.  No signs of upper airway obstruction   ED Results / Procedures / Treatments   Labs (all labs ordered are listed, but only abnormal  results are displayed) Labs Reviewed - No data to display  EKG   RADIOLOGY   Official radiology report(s): No results found.  PROCEDURES and INTERVENTIONS:  Procedures  Medications  predniSONE (DELTASONE) tablet 60 mg (60 mg Oral Given 09/29/21 0209)  famotidine (PEPCID) tablet 20 mg (20 mg Oral Given 09/29/21 0208)     IMPRESSION / MDM / ASSESSMENT AND PLAN / ED COURSE  I reviewed the triage vital signs and the nursing notes.  Differential diagnosis includes, but is not limited to, hives, anaphylaxis, atopic dermatitis  {Patient presents with symptoms of an acute illness or injury that is potentially life-threatening.  No woman presents to the ED after a resolved episode of scattered hives.  Looks well without signs of anaphylaxis, systemic illness, atopic dermatitis or more severe illnesses.  We discussed Benadryl and return precautions.      FINAL CLINICAL IMPRESSION(S) / ED DIAGNOSES   Final diagnoses:  Urticaria  Hives     Rx / DC Orders   ED Discharge Orders  None        Note:  This document was prepared using Dragon voice recognition software and may include unintentional dictation errors.   Delton Prairie, MD 09/29/21 [phone]

## 2021-09-29 NOTE — ED Notes (Signed)
Pt dc to home. Dc instructions reviewed with all questions answered. Pt verbalizes understanding. Pt ambulatory out of dept with steady gait 

## 2021-09-29 NOTE — Discharge Instructions (Addendum)
Use Benadryl as needed.  1-2 pills, 25-50 mg.  If anything worsens despite this like he cannot breathe, the please return to the ED.

## 2021-10-04 ENCOUNTER — Other Ambulatory Visit: Payer: Medicaid Other

## 2021-10-05 ENCOUNTER — Observation Stay
Admission: EM | Admit: 2021-10-05 | Discharge: 2021-10-06 | Disposition: A | Discharge status: Home / Self Care | Payer: Medicaid Other | Attending: Licensed Practical Nurse | Admitting: Licensed Practical Nurse

## 2021-10-05 ENCOUNTER — Encounter: Payer: Self-pay | Admitting: Obstetrics and Gynecology

## 2021-10-05 ENCOUNTER — Other Ambulatory Visit: Payer: Self-pay

## 2021-10-05 DIAGNOSIS — Z3480 Encounter for supervision of other normal pregnancy, unspecified trimester: Secondary | ICD-10-CM

## 2021-10-05 DIAGNOSIS — O99342 Other mental disorders complicating pregnancy, second trimester: Principal | ICD-10-CM | POA: Insufficient documentation

## 2021-10-05 DIAGNOSIS — Z79899 Other long term (current) drug therapy: Secondary | ICD-10-CM | POA: Diagnosis not present

## 2021-10-05 DIAGNOSIS — R102 Pelvic and perineal pain: Secondary | ICD-10-CM | POA: Insufficient documentation

## 2021-10-05 DIAGNOSIS — W01198A Fall on same level from slipping, tripping and stumbling with subsequent striking against other object, initial encounter: Secondary | ICD-10-CM | POA: Diagnosis not present

## 2021-10-05 DIAGNOSIS — O26892 Other specified pregnancy related conditions, second trimester: Secondary | ICD-10-CM | POA: Diagnosis not present

## 2021-10-05 DIAGNOSIS — F411 Generalized anxiety disorder: Secondary | ICD-10-CM

## 2021-10-05 DIAGNOSIS — Z3A27 27 weeks gestation of pregnancy: Secondary | ICD-10-CM | POA: Diagnosis not present

## 2021-10-05 MED ORDER — HYDROXYZINE HCL 25 MG PO TABS: 50.0000 mg | ORAL_TABLET | Freq: Four times a day (QID) | ORAL | Status: DC | PRN

## 2021-10-05 MED ORDER — ACETAMINOPHEN 325 MG PO TABS: 650.0000 mg | ORAL_TABLET | ORAL | Status: DC | PRN

## 2021-10-05 MED ORDER — LORAZEPAM 1 MG PO TABS
0.5000 mg | ORAL_TABLET | Freq: Once | ORAL | Status: AC | PRN
Start: 2021-10-05 — End: 2021-10-05
  Administered 2021-10-05: 0.5 mg via ORAL
  Filled 2021-10-05: qty 1
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
## 2021-10-05 NOTE — OB Triage Note (Signed)
Progress Note  After reaching out to multiple providers, I was able to speak with the ED behavioral health charge nurse and the ED [facility] triage nurse. They stated that there is no behavioral health provider available tonight who is credentialed to see patients outside of the ED. They stated that the oncoming provider for the morning would be able to assess [name]. I discussed this information with Crystal Melendez and recommended that she stay overnight in triage in order to be assessed in the morning. She is in agreement with plan and states "I really do need help." She is open to taking Ativan at bedtime. She is having some ongoing back and pelvic pain from the fall and would like to try a heating pad. Her fetal monitoring has been reassuring. Will allow her to rest tonight and will check in with her in the AM.  Crystal Melendez, CNM

## 2021-10-05 NOTE — Progress Notes (Signed)
I called BH NP listed on Amion who stated she is not on call and to call the Behavioral Health unit and speak to the provider present. I called the unit, and they stated that there is no provider there but there will be provider in the ED at 2000.  They advised calling the MD backup listed on Amnion. I called this provider and left a message. I then contacted Verner Chol, the advanced care supervisor. She advised calling Dr. Lucianne Muss and also attempted to make contact with other providers. Left message with Dr. Lucianne Muss. Verner Chol called back to advise that if I have placed a psych consult, I should expect to hear from a provider at some point but unsure what the time frame would be. There is no number available for the on-call provider. Will contact the ED provider at 2000.  M. Chryl Heck, CNM

## 2021-10-06 ENCOUNTER — Encounter: Payer: Self-pay | Admitting: Obstetrics and Gynecology

## 2021-10-06 ENCOUNTER — Other Ambulatory Visit: Payer: Self-pay | Admitting: Licensed Practical Nurse

## 2021-10-06 DIAGNOSIS — F411 Generalized anxiety disorder: Secondary | ICD-10-CM | POA: Diagnosis not present

## 2021-10-06 DIAGNOSIS — Z3A27 27 weeks gestation of pregnancy: Secondary | ICD-10-CM

## 2021-10-06 DIAGNOSIS — W19XXXA Unspecified fall, initial encounter: Secondary | ICD-10-CM | POA: Diagnosis not present

## 2021-10-06 DIAGNOSIS — O99342 Other mental disorders complicating pregnancy, second trimester: Secondary | ICD-10-CM

## 2021-10-06 DIAGNOSIS — F419 Anxiety disorder, unspecified: Secondary | ICD-10-CM

## 2021-10-06 HISTORY — DX: Generalized anxiety disorder: F41.1

## 2021-10-06 MED ORDER — ACETAMINOPHEN 325 MG PO TABS: 650.0000 mg | ORAL_TABLET | ORAL | Status: DC | PRN

## 2021-10-06 MED ORDER — PROPRANOLOL HCL 10 MG PO TABS: 10.0000 mg | ORAL_TABLET | Freq: Two times a day (BID) | ORAL | Status: DC | PRN

## 2021-10-06 MED ORDER — PROPRANOLOL HCL 10 MG PO TABS: 10.0000 mg | ORAL_TABLET | Freq: Two times a day (BID) | ORAL | 1 refills | Status: DC | PRN

## 2021-10-06 NOTE — Progress Notes (Addendum)
Consults are seen within 24 hours.  The shift for the provider last night ended at 6 pm when the consult was placed.  As with other consults, the client will be seen within the 24-hour time period.  This provider will complete consult once the ED consults are finished.  Nanine Means, PMHNP

## 2021-10-06 NOTE — Progress Notes (Signed)
See in L and D triage for fall, but reporting symptoms anxiety and depression. Seen by psych NP, recommend outpatient management with a psychiatrist. Pt desires care with [facility] perinatal mood disorder clinic.  Referral placed Carie Caddy, CNM  Domingo Pulse, [facility]  10/06/21  12:51 PM

## 2021-10-06 NOTE — Progress Notes (Signed)
Pt. Given written instructions about medications and given handouts to manage stress/anxiety/panic attacks. Pt. Scheduled for appt w/ [place] and labs on 10/11/2021. Pt demonstrated understanding about educational materials and was discharged to home in stable condition.

## 2021-10-06 NOTE — TOC Initial Note (Signed)
Transition of Care [facility]) - Initial/Assessment Note    Patient Details  Name: Crystal Melendez MRN: [id number] Date of Birth: 2000/12/23  Transition of Care [facility]) CM/SW Contact:    Allayne Butcher, RN Phone Number: 10/06/2021, 9:39 AM  Clinical Narrative:                 [facility] consult acknowledged.  Psychiatry will be seeing patient for her severe anxiety. TOC will sign off.  Please re-consult if other needs arise.          Patient Goals and CMS Choice        Expected Discharge Plan and Services                                                Prior Living Arrangements/Services                       Activities of Daily Living      Permission Sought/Granted                  Emotional Assessment              Admission diagnosis:  Labor and delivery, indication for care [O75.9] Patient Active Problem List   Diagnosis Date Noted   Labor and delivery, indication for care 10/05/2021   Back pain affecting pregnancy 09/23/2021   [redacted] weeks gestation of pregnancy    Supervision of other normal pregnancy, antepartum 06/23/2021   Adjustment disorder with depressed mood    MDD (major depressive disorder), recurrent episode, severe (HCC) 05/14/2017   Bipolar 2 disorder (HCC) 04/24/2017   PCP:  Nira Retort Pharmacy:   [id number] [place], [place] - [address] AT [facility] OF [address] [place] [address] Phone: [phone] Fax: [phone]     Social Determinants of Health (SDOH) Interventions    Readmission Risk Interventions     No data to display

## 2021-10-06 NOTE — Discharge Summary (Cosign Needed Addendum)
Physician Final Progress Note  Patient ID: Crystal Melendez MRN: [id number] DOB/AGE: [birth year] 20 y.o.  Admit date: 10/05/2021 Admitting provider: Linzie Collin, MD Discharge date: 10/06/2021   Admission Diagnoses:  1) intrauterine pregnancy at [redacted]w[redacted]d  2) s/p fall  Discharge Diagnoses:  Active Problems:   Generalized anxiety disorder    History of Present Illness: The patient is a 21 y.o. female G3P2002 at [redacted]w[redacted]d who presents for evaluation after a fall.  She reports that she tripped while she was walking and fell into a gate. She hit the side of her stomach. She denies abdominal pain, bruising, or contractions. She does report mild pelvic pain. She endorses good fetal movement.    On arrival to the unit: Crystal Melendez reported that she had  been experiencing a significant exacerbation in her anxiety and feelings of being overwhelmed. She is teary and states that she feels scared all the time. She feels that she has gotten into bad situations because she worries that if she tells someone no, then "everything will change." For example, she felt pressured into lying for her husband because she was worried that he would leave if she did not, even though she also felt she could get in significant trouble for the lie. She reports that she feels like this all the time. She has tried the hydroxyzine she was prescribed, but she does not like the drowsy sensation it gave her. She has very limited social support and does not feel that she can talk freely with friends or family. She came to L&D today because she was scared to be alone. She denies suicidal ideation. She states that she is very overwhelmed all the time and needs help. Crystal Melendez reports that she moved to [place] to be near her family, but living away from her husband-who is her main support person, is causing a lot of anxiety for her.  She will experience great anxiety followed by a depression once the anxiety has settled  Past Medical History:  Diagnosis  Date   ADHD    states when little; on no medication   Bipolar 1 disorder (HCC)    states dx'ed years ago; no medication   Generalized anxiety disorder 10/06/2021    Past Surgical History:  Procedure Laterality Date   DENTAL SURGERY     all four age 33    No current facility-administered medications on file prior to encounter.   Current Outpatient Medications on File Prior to Encounter  Medication Sig Dispense Refill   hydrOXYzine (ATARAX) 10 MG tablet Take 1 tablet (10 mg total) by mouth 3 (three) times daily as needed. (Patient not taking: Reported on 10/05/2021) 30 tablet 2    Allergies  Allergen Reactions   Sulfa Antibiotics Rash    Social History   Socioeconomic History   Marital status: Married    Spouse name: Not on file   Number of children: 2   Years of education: 12   Highest education level: Not on file  Occupational History   Occupation: Consulting civil engineer  Tobacco Use   Smoking status: Never   Smokeless tobacco: Never  Vaping Use   Vaping Use: Former   Substances: Nicotine   Devices: last use one month ago  Substance and Sexual Activity   Alcohol use: Not Currently    Comment: last use 4 years ago   Drug use: Not Currently    Frequency: 3.0 times per week    Types: Marijuana    Comment: last use 4 years ago  Sexual activity: Yes    Birth control/protection: Pill    Comment: nexplanon removed 4-6 months ago; last used progestin only pill before current pregnancy  Other Topics Concern   Not on file  Social History Narrative   Not on file   Social Determinants of Health   Financial Resource Strain: Low Risk  (06/23/2021)   Overall Financial Resource Strain (CARDIA)    Difficulty of Paying Living Expenses: Not hard at all  Food Insecurity: No Food Insecurity (06/23/2021)   Hunger Vital Sign    Worried About Running Out of Food in the Last Year: Never true    Ran Out of Food in the Last Year: Never true  Transportation Needs: No Transportation Needs  (06/23/2021)   PRAPARE - Administrator, Civil Service (Medical): No    Lack of Transportation (Non-Medical): No  Physical Activity: Not on file  Stress: No Stress Concern Present (06/23/2021)   [employer] of Occupational Health - Occupational Stress Questionnaire    Feeling of Stress : Not at all  Social Connections: Moderately Integrated (06/23/2021)   Social Connection and Isolation Panel [NHANES]    Frequency of Communication with Friends and Family: Once a week    Frequency of Social Gatherings with Friends and Family: More than three times a week    Attends Religious Services: 1 to 4 times per year    Active Member of [employer] or Organizations: No    Attends Banker Meetings: Never    Marital Status: Married  Catering manager Violence: Not At Risk (06/23/2021)   Humiliation, Afraid, Rape, and Kick questionnaire    Fear of Current or Ex-Partner: No    Emotionally Abused: No    Physically Abused: No    Sexually Abused: No    Family History  Problem Relation Age of Onset   Diabetes Maternal Grandmother    Bipolar disorder Maternal Grandmother      Review of Systems  Constitutional: Negative.   Respiratory: Negative.    Cardiovascular: Negative.   Gastrointestinal: Negative.   Genitourinary: Negative.   Neurological: Negative.   Psychiatric/Behavioral:  Positive for depression. Negative for suicidal ideas. The patient is nervous/anxious.      Physical Exam: BP 116/74 (BP Location: Left Arm)   Pulse 98   Temp 98.6 F (37 C) (Oral)   Resp 16   Ht 5\' 1"  (1.549 m)   Wt 68 kg   LMP 03/29/2021 (Approximate)   BMI 28.33 kg/m   Physical Exam Constitutional:      Appearance: Normal appearance.  Abdominal:     Comments: gravid  Musculoskeletal:        General: Normal range of motion.     Cervical back: Normal range of motion.  Neurological:     Mental Status: She is alert.  Psychiatric:        Mood and Affect: Mood normal.        Thought  Content: Thought content normal.        Judgment: Judgment normal.   EFM: baseline 140, moderate variability, pos accel (10x120) neg decel TCOC: no contractions   Consults: psychiatry  Significant Findings/ Diagnostic Studies: NA  Procedures: RNST  Hospital Course: The patient was admitted to Labor and Delivery Triage for observation. A psychiatric consult was placed at 6pm on 9/7. A provider was not available until today. She was seen by the psych NP and deemed stable for discharge with outpatient management. Pt desires referral to [facility] perinatal mood disorder  clinic. Referral placed.  Discharge Condition: stable  Call to [facility], [phone], case # [id number], reported dx of Generalized anxiety disorder, the pt was discharged in stable condition, expressed my concern that the pt needs support but  I am not sure if this constitutes the pt's husband being brought home, the person I spoke with stated his commanders will review and then decide if he is able to be released.   Disposition: Discharge disposition: 01-Home or Self Care       Diet: Regular diet  Discharge Activity: Activity as tolerated   Allergies as of 10/06/2021       Reactions   Sulfa Antibiotics Rash        Medication List     TAKE these medications    acetaminophen 325 MG tablet Commonly known as: TYLENOL Take 2 tablets (650 mg total) by mouth every 4 (four) hours as needed (for pain scale < 4  OR  temperature  >/=  100.5 F).   hydrOXYzine 10 MG tablet Commonly known as: ATARAX Take 1 tablet (10 mg total) by mouth 3 (three) times daily as needed.   propranolol 10 MG tablet Commonly known as: INDERAL Take 1 tablet (10 mg total) by mouth 2 (two) times daily as needed (anxiety).        Total time spent with pt 20 minutes  Signed: Ellouise Newer [facility], CNM  10/06/2021, 12:57 PM

## 2021-10-06 NOTE — OB Triage Note (Signed)
Progress Note  Crystal Melendez rested overnight. Spoke with ED intake this morning. They stated that a provider will be available today to see her. Contact information for oncoming CNM provided.  [name], CNM

## 2021-10-06 NOTE — Consult Note (Signed)
[facility] Face-to-Face Psychiatry Consult   Reason for Consult:  anxiety Referring Physician:  [name] Patient Identification: Crystal Melendez MRN:  [id number] Principal Diagnosis: <principal problem not specified> Diagnosis:  Active Problems:   Generalized anxiety disorder   Total Time spent with patient: 45 minutes  Subjective:   Crystal Melendez is a 21 y.o. female patient admitted with anxiety.  HPI:  21 yo female admitted after a panic attack.  High anxiety with panic attacks at times, last one was prior to admission.  Depression noted after she experience the day after a very anxious day.  Denies suicidal/homicidal ideations, hallucinations, or substance use.  This is her third baby and her anxiety started when she moved from [place] where her husband is stationed to [place] to be with her family.  He is supposed to be released in November and will move here at that time.  Her sleep is fair related to being pregnant.  Appetite is good unless she is very anxious.  She has a history of Bipolar 2 disorder, contraindicating the use of an antidepressant without a mood stabilizer.  She did not like the way hydroxyzine made her feel earlier today.  Ordered propranolol PRN to assist with anxiety.  Zoloft would be  preferred but may cause a manic episode with her underlying bipolar disorder, follow up with outpatient psychiatry.  Past Psychiatric History: bipolar disorder, anxiety, depression  Risk to Self:  none Risk to Others:  none Prior Inpatient Therapy:  yes Prior Outpatient Therapy:  none currently  Past Medical History:  Past Medical History:  Diagnosis Date   ADHD    states when little; on no medication   Bipolar 1 disorder (HCC)    states dx'ed years ago; no medication   Generalized anxiety disorder 10/06/2021    Past Surgical History:  Procedure Laterality Date   DENTAL SURGERY     all four age 69   Family History:  Family History  Problem Relation Age of Onset   Diabetes  Maternal Grandmother    Bipolar disorder Maternal Grandmother    Family Psychiatric  History: see above Social History:  Social History   Substance and Sexual Activity  Alcohol Use Not Currently   Comment: last use 4 years ago     Social History   Substance and Sexual Activity  Drug Use Not Currently   Frequency: 3.0 times per week   Types: Marijuana   Comment: last use 4 years ago    Social History   Socioeconomic History   Marital status: Married    Spouse name: Not on file   Number of children: 2   Years of education: 12   Highest education level: Not on file  Occupational History   Occupation: Consulting civil engineer  Tobacco Use   Smoking status: Never   Smokeless tobacco: Never  Vaping Use   Vaping Use: Former   Substances: Nicotine   Devices: last use one month ago  Substance and Sexual Activity   Alcohol use: Not Currently    Comment: last use 4 years ago   Drug use: Not Currently    Frequency: 3.0 times per week    Types: Marijuana    Comment: last use 4 years ago   Sexual activity: Yes    Birth control/protection: Pill    Comment: nexplanon removed 4-6 months ago; last used progestin only pill before current pregnancy  Other Topics Concern   Not on file  Social History Narrative   Not on file  Social Determinants of Health   Financial Resource Strain: Low Risk  (06/23/2021)   Overall Financial Resource Strain (CARDIA)    Difficulty of Paying Living Expenses: Not hard at all  Food Insecurity: No Food Insecurity (06/23/2021)   Hunger Vital Sign    Worried About Running Out of Food in the Last Year: Never true    Ran Out of Food in the Last Year: Never true  Transportation Needs: No Transportation Needs (06/23/2021)   PRAPARE - Administrator, Civil Service (Medical): No    Lack of Transportation (Non-Medical): No  Physical Activity: Not on file  Stress: No Stress Concern Present (06/23/2021)   [employer] of Occupational Health - Occupational  Stress Questionnaire    Feeling of Stress : Not at all  Social Connections: Moderately Integrated (06/23/2021)   Social Connection and Isolation Panel [NHANES]    Frequency of Communication with Friends and Family: Once a week    Frequency of Social Gatherings with Friends and Family: More than three times a week    Attends Religious Services: 1 to 4 times per year    Active Member of [employer] or Organizations: No    Attends Banker Meetings: Never    Marital Status: Married   Additional Social History:    Allergies:   Allergies  Allergen Reactions   Sulfa Antibiotics Rash    Labs: No results found for this or any previous visit (from the past 48 hour(s)).  Current Facility-Administered Medications  Medication Dose Route Frequency Provider Last Rate Last Admin   acetaminophen (TYLENOL) tablet 650 mg  650 mg Oral Q4H PRN Chryl Heck, Melissa M, CNM       propranolol (INDERAL) tablet 10 mg  10 mg Oral BID PRN Charm Rings, NP        Musculoskeletal: Strength & Muscle Tone: within normal limits Gait & Station: normal Patient leans: N/A  Psychiatric Specialty Exam: Physical Exam Vitals and nursing note reviewed.  Constitutional:      Appearance: Normal appearance.  HENT:     Head: Normocephalic.     Nose: Nose normal.  Pulmonary:     Effort: Pulmonary effort is normal.  Musculoskeletal:        General: Normal range of motion.     Cervical back: Normal range of motion.  Neurological:     General: No focal deficit present.     Mental Status: She is alert and oriented to person, place, and time.  Psychiatric:        Attention and Perception: Attention and perception normal.        Mood and Affect: Mood is anxious.        Speech: Speech normal.        Behavior: Behavior normal. Behavior is cooperative.        Thought Content: Thought content normal.        Cognition and Memory: Cognition and memory normal.        Judgment: Judgment normal.     Review of  Systems  Psychiatric/Behavioral:  The patient is nervous/anxious.   All other systems reviewed and are negative.   Blood pressure 116/74, pulse 98, temperature 98.6 F (37 C), temperature source Oral, resp. rate 16, height 5\' 1"  (1.549 m), weight 68 kg, last menstrual period 03/29/2021, not currently breastfeeding.Body mass index is 28.34 kg/m.  General Appearance: Casual  Eye Contact:  Good  Speech:  Normal Rate  Volume:  Normal  Mood:  Anxious  Affect:  Congruent  Thought Process:  Coherent  Orientation:  Full (Time, Place, and Person)  Thought Content:  WDL and Logical  Suicidal Thoughts:  No  Homicidal Thoughts:  No  Memory:  Immediate;   Good Recent;   Good Remote;   Good  Judgement:  Fair  Insight:  Fair  Psychomotor Activity:  Normal  Concentration:  Concentration: Good and Attention Span: Good  Recall:  Good  Fund of Knowledge:  Good  Language:  Good  Akathisia:  No  Handed:  Right  AIMS (if indicated):     Assets:  Housing Intimacy Leisure Time Physical Health Resilience Social Support  ADL's:  Intact  Cognition:  WNL  Sleep:        Physical Exam: Physical Exam Vitals and nursing note reviewed.  Constitutional:      Appearance: Normal appearance.  HENT:     Head: Normocephalic.     Nose: Nose normal.  Pulmonary:     Effort: Pulmonary effort is normal.  Musculoskeletal:        General: Normal range of motion.     Cervical back: Normal range of motion.  Neurological:     General: No focal deficit present.     Mental Status: She is alert and oriented to person, place, and time.  Psychiatric:        Attention and Perception: Attention and perception normal.        Mood and Affect: Mood is anxious.        Speech: Speech normal.        Behavior: Behavior normal. Behavior is cooperative.        Thought Content: Thought content normal.        Cognition and Memory: Cognition and memory normal.        Judgment: Judgment normal.    Review of Systems   Psychiatric/Behavioral:  The patient is nervous/anxious.   All other systems reviewed and are negative.  Blood pressure 116/74, pulse 98, temperature 98.6 F (37 C), temperature source Oral, resp. rate 16, height 5\' 1"  (1.549 m), weight 68 kg, last menstrual period 03/29/2021, not currently breastfeeding. Body mass index is 28.34 kg/m.  Treatment Plan Summary: General anxiety disorder: Started propranolol 10 mg BID PRN Follow up with a psychiatrist and therapist  Disposition: No evidence of imminent risk to self or others at present.   Patient does not meet criteria for psychiatric inpatient admission. Supportive therapy provided about ongoing stressors.  05/29/2021, NP 10/06/2021 11:54 AM

## 2021-10-09 ENCOUNTER — Other Ambulatory Visit: Payer: Self-pay | Admitting: Obstetrics

## 2021-10-09 DIAGNOSIS — O35EXX0 Maternal care for other (suspected) fetal abnormality and damage, fetal genitourinary anomalies, not applicable or unspecified: Secondary | ICD-10-CM

## 2021-10-11 ENCOUNTER — Encounter: Payer: Medicaid Other | Admitting: Certified Nurse Midwife

## 2021-10-11 ENCOUNTER — Ambulatory Visit (INDEPENDENT_AMBULATORY_CARE_PROVIDER_SITE_OTHER): Payer: Medicaid Other

## 2021-10-11 ENCOUNTER — Ambulatory Visit (INDEPENDENT_AMBULATORY_CARE_PROVIDER_SITE_OTHER): Payer: Medicaid Other | Admitting: Certified Nurse Midwife

## 2021-10-11 ENCOUNTER — Encounter: Payer: Self-pay | Admitting: Certified Nurse Midwife

## 2021-10-11 ENCOUNTER — Other Ambulatory Visit: Payer: Medicaid Other

## 2021-10-11 VITALS — BP 107/73 | HR 71 | Wt 154.1 lb

## 2021-10-11 DIAGNOSIS — Z3A27 27 weeks gestation of pregnancy: Secondary | ICD-10-CM | POA: Diagnosis not present

## 2021-10-11 DIAGNOSIS — Z3A28 28 weeks gestation of pregnancy: Secondary | ICD-10-CM

## 2021-10-11 DIAGNOSIS — O35EXX0 Maternal care for other (suspected) fetal abnormality and damage, fetal genitourinary anomalies, not applicable or unspecified: Secondary | ICD-10-CM

## 2021-10-11 DIAGNOSIS — Z3480 Encounter for supervision of other normal pregnancy, unspecified trimester: Secondary | ICD-10-CM

## 2021-10-11 MED ORDER — TETANUS-DIPHTH-ACELL PERTUSSIS 5-2.5-18.5 LF-MCG/0.5 IM SUSY
0.5000 mL | PREFILLED_SYRINGE | Freq: Once | INTRAMUSCULAR | Status: AC
  Administered 2021-10-11: 0.5 mL via INTRAMUSCULAR

## 2021-10-11 NOTE — Patient Instructions (Signed)
Oral Glucose Tolerance Test During Pregnancy Why am I having this test? The oral glucose tolerance test (OGTT) is done to check how your body processes blood sugar (glucose). This is one of several tests used to diagnose diabetes that develops during pregnancy (gestational diabetes mellitus). Gestational diabetes is a short-term form of diabetes that some women develop while they are pregnant. It usually occurs during the second trimester of pregnancy and goes away after delivery. Testing, or screening, for gestational diabetes usually occurs at weeks 24-28 of pregnancy. You may have the OGTT test after having a 1-hour glucose screening test if the results from that test indicate that you may have gestational diabetes. This test may also be needed if: You have a history of gestational diabetes. There is a history of giving birth to very large babies or of losing pregnancies (having stillbirths). You have signs and symptoms of diabetes, such as: Changes in your eyesight. Tingling or numbness in your hands or feet. Changes in hunger, thirst, and urination, and these are not explained by your pregnancy. What is being tested? This test measures the amount of glucose in your blood at different times during a period of 3 hours. This shows how well your body can process glucose. What kind of sample is taken?  Blood samples are required for this test. They are usually collected by inserting a needle into a blood vessel. How do I prepare for this test? For 3 days before your test, eat normally. Have plenty of carbohydrate-rich foods. Follow instructions from your health care provider about: Eating or drinking restrictions on the day of the test. You may be asked not to eat or drink anything other than water (to fast) starting 8-10 hours before the test. Changing or stopping your regular medicines. Some medicines may interfere with this test. Tell a health care provider about: All medicines you are  taking, including vitamins, herbs, eye drops, creams, and over-the-counter medicines. Any blood disorders you have. Any surgeries you have had. Any medical conditions you have. What happens during the test? First, your blood glucose will be measured. This is referred to as your fasting blood glucose because you fasted before the test. Then, you will drink a glucose solution that contains a certain amount of glucose. Your blood glucose will be measured again 1, 2, and 3 hours after you drink the solution. This test takes about 3 hours to complete. You will need to stay at the testing location during this time. During the testing period: Do not eat or drink anything other than the glucose solution. Do not exercise. Do not use any products that contain nicotine or tobacco, such as cigarettes, e-cigarettes, and chewing tobacco. These can affect your test results. If you need help quitting, ask your health care provider. The testing procedure may vary among health care providers and hospitals. How are the results reported? Your results will be reported as milligrams of glucose per deciliter of blood (mg/dL) or millimoles per liter (mmol/L). There is more than one source for screening and diagnosis reference values used to diagnose gestational diabetes. Your health care provider will compare your results to normal values that were established after testing a large group of people (reference values). Reference values may vary among labs and hospitals. For this test (Carpenter-Coustan), reference values are: Fasting: 95 mg/dL (5.3 mmol/L). 1 hour: 180 mg/dL (10.0 mmol/L). 2 hour: 155 mg/dL (8.6 mmol/L). 3 hour: 140 mg/dL (7.8 mmol/L). What do the results mean? Results below the reference values are   considered normal. If two or more of your blood glucose levels are at or above the reference values, you may be diagnosed with gestational diabetes. If only one level is high, your health care provider may  suggest repeat testing or other tests to confirm a diagnosis. Talk with your health care provider about what your results mean. Questions to ask your health care provider Ask your health care provider, or the department that is doing the test: When will my results be ready? How will I get my results? What are my treatment options? What other tests do I need? What are my next steps? Summary The oral glucose tolerance test (OGTT) is one of several tests used to diagnose diabetes that develops during pregnancy (gestational diabetes mellitus). Gestational diabetes is a short-term form of diabetes that some women develop while they are pregnant. You may have the OGTT test after having a 1-hour glucose screening test if the results from that test show that you may have gestational diabetes. You may also have this test if you have any symptoms or risk factors for this type of diabetes. Talk with your health care provider about what your results mean. This information is not intended to replace advice given to you by your health care provider. Make sure you discuss any questions you have with your health care provider. Document Revised: 06/25/2019 Document Reviewed: 06/25/2019 Elsevier Patient Education  2023 Elsevier Inc.  

## 2021-10-11 NOTE — Progress Notes (Signed)
ROB doing well. Feels good movement. 28 wk labs today: Glucose screen/RPR/CBC. Tdap. Blood transfusion consent completed, all questions answered. Ready set baby reviewed, see check list for topics covered. Sample birth plan given, will follow up in upcoming visits. Discussed birth control after delivery, information pamphlet given. She is planning natural undedicated birth and would like to move around as much as able.   U/s today follow up for Mild elevated reneal pelvis. See below.  Patient Name: Crystal Melendez DOB: 17-Jul-2000 MRN: [id number]  ULTRASOUND REPORT  Location: [facility] Date of Service: 10/11/2021   Indications:f/u Right Pyelectasis Findings:  [name] intrauterine pregnancy is visualized with FHR at 137 BPM.   Biometrics gives an (U/S) Gestational age of [redacted]w[redacted]d and an (U/S) EDD of 01/07/2023; this correlates with the clinically established Estimated Date of Delivery: 01/03/22.   Fetal presentation is Cephalic.  Placenta: anterior.  AFI: 19.9 cm  Growth percentile is 43rd.   AC percentile is 41st. EFW: 1108g (2lbs 7oz)  Right Renal Pelvis measures 3.15mm on today's ultrasound; this is considered wnl (<44mm is wnl >/=28 weeks)  Impression: 1. [redacted]w[redacted]d Viable Single Intrauterine pregnancy dated by previously established criteria. 2. Growth is 41st %ile.  AFI is 19.9 cm.  3. Rt fetal pyelectasis appears to be resolved  Recommendations: 1.Clinical correlation with the patient's History and Physical Exam.   Willette Alma, RDMS, RVT   Follow up 2 wk with Missy for ROB or sooner if needed.    Doreene Burke, CNM

## 2021-10-12 LAB — CBC
Hematocrit: 36.5 % (ref 34.0–46.6)
Hemoglobin: 12.7 g/dL (ref 11.1–15.9)
MCH: 32.2 pg (ref 26.6–33.0)
MCHC: 34.8 g/dL (ref 31.5–35.7)
MCV: 92 fL (ref 79–97)
Platelets: 229 10*3/uL (ref 150–450)
RBC: 3.95 x10E6/uL (ref 3.77–5.28)
RDW: 11.2 % — ABNORMAL LOW (ref 11.7–15.4)
WBC: 7.2 10*3/uL (ref 3.4–10.8)

## 2021-10-12 LAB — GLUCOSE, 1 HOUR GESTATIONAL: Gestational Diabetes Screen: 106 mg/dL (ref 70–139)

## 2021-10-12 LAB — RPR: RPR Ser Ql: NONREACTIVE

## 2021-10-13 ENCOUNTER — Encounter: Payer: Self-pay | Admitting: Certified Nurse Midwife

## 2021-10-25 ENCOUNTER — Ambulatory Visit (INDEPENDENT_AMBULATORY_CARE_PROVIDER_SITE_OTHER): Payer: Medicaid Other | Admitting: Obstetrics

## 2021-10-25 ENCOUNTER — Encounter: Payer: Self-pay | Admitting: Obstetrics

## 2021-10-25 VITALS — BP 119/77 | HR 105 | Wt 157.0 lb

## 2021-10-25 DIAGNOSIS — Z3483 Encounter for supervision of other normal pregnancy, third trimester: Secondary | ICD-10-CM

## 2021-10-25 DIAGNOSIS — Z3A30 30 weeks gestation of pregnancy: Secondary | ICD-10-CM

## 2021-10-25 LAB — POCT URINALYSIS DIPSTICK OB
Bilirubin, UA: NEGATIVE
Blood, UA: NEGATIVE
Glucose, UA: NEGATIVE
Ketones, UA: NEGATIVE
Leukocytes, UA: NEGATIVE
Nitrite, UA: NEGATIVE
POC,PROTEIN,UA: NEGATIVE
Spec Grav, UA: 1.015 (ref 1.010–1.025)
Urobilinogen, UA: 0.2 E.U./dL
pH, UA: 7 (ref 5.0–8.0)

## 2021-10-25 NOTE — Progress Notes (Signed)
ROB at [redacted]w[redacted]d. Active baby. Crystal Melendez is having a lot of heartburn but otherwise feels well. Discussed comfort measures. Using propranolol PRN for anxiety but feels mood has stabilized some. Feeling a little down because her partner's [employer] training has been extended to November. Has appt set with [facility]. Reviewed birth. Desires minimal interventions, freedom of movement, limited cervical exams, no students, partner to catch baby. Discussed s/s of PTL and when to go to the hospital. RTC in 2 weeks.  Lloyd Huger, CNM

## 2021-11-08 ENCOUNTER — Encounter: Payer: Medicaid Other | Admitting: Certified Nurse Midwife

## 2021-11-09 ENCOUNTER — Ambulatory Visit (INDEPENDENT_AMBULATORY_CARE_PROVIDER_SITE_OTHER): Payer: Medicaid Other | Admitting: Certified Nurse Midwife

## 2021-11-09 ENCOUNTER — Encounter: Payer: Self-pay | Admitting: Certified Nurse Midwife

## 2021-11-09 VITALS — BP 110/73 | HR 93 | Wt 160.2 lb

## 2021-11-09 DIAGNOSIS — Z3A32 32 weeks gestation of pregnancy: Secondary | ICD-10-CM

## 2021-11-09 DIAGNOSIS — Z3483 Encounter for supervision of other normal pregnancy, third trimester: Secondary | ICD-10-CM

## 2021-11-09 LAB — POCT URINALYSIS DIPSTICK OB
Bilirubin, UA: NEGATIVE
Blood, UA: NEGATIVE
Glucose, UA: NEGATIVE
Ketones, UA: NEGATIVE
Leukocytes, UA: NEGATIVE
Nitrite, UA: NEGATIVE
POC,PROTEIN,UA: NEGATIVE
Spec Grav, UA: 1.01 (ref 1.010–1.025)
Urobilinogen, UA: 0.2 E.U./dL
pH, UA: 6 (ref 5.0–8.0)

## 2021-11-09 NOTE — Patient Instructions (Signed)
Round Ligament Pain  The round ligaments are a pair of cord-like tissues that help support the uterus. They can become a source of pain during pregnancy as the ligaments soften and stretch as the baby grows. The pain usually begins in the second trimester (13-28 weeks) of pregnancy, and should only last for a few seconds when it occurs. However, the pain can come and go until the baby is delivered. The pain does not cause harm to the baby. Round ligament pain is usually a short, sharp, and pinching pain, but it can also be a dull, lingering, and aching pain. The pain is felt in the lower side of the abdomen or in the groin. It usually starts deep in the groin and moves up to the outside of the hip area. The pain may happen when you: Suddenly change position, such as quickly going from a sitting to standing position. Do physical activity. Cough or sneeze. Follow these instructions at home: Managing pain  When the pain starts, relax. Then, try any of these methods to help with the pain: Sit down. Flex your knees up to your abdomen. Lie on your side with one pillow under your abdomen and another pillow between your legs. Sit in a warm bath for 15-20 minutes or until the pain goes away. General instructions Watch your condition for any changes. Move slowly when you sit down or stand up. Stop or reduce your physical activities if they cause pain. Avoid long walks if they cause pain. Take over-the-counter and prescription medicines only as told by your health care provider. Keep all follow-up visits. This is important. Contact a health care provider if: Your pain does not go away with treatment. You feel pain in your back that you did not have before. Your medicine is not helping. You have a fever or chills. You have nausea or vomiting. You have diarrhea. You have pain when you urinate. Get help right away if: You have pain that is a rhythmic, cramping pain similar to labor pains. Labor  pains are usually 2 minutes apart, last for about 1 minute, and involve a bearing down feeling or pressure in your pelvis. You have vaginal bleeding. These symptoms may represent a serious problem that is an emergency. Do not wait to see if the symptoms will go away. Get medical help right away. Call your local emergency services (911 in the [place] Do not drive yourself to the hospital. Summary Round ligament pain is felt in the lower abdomen or groin. This pain usually begins in the second trimester (13-28 weeks) and should only last for a few seconds when it occurs. You may notice the pain when you suddenly change position, when you cough or sneeze, or during physical activity. Relaxing, flexing your knees to your abdomen, lying on one side, or taking a warm bath may help to get rid of the pain. Contact your health care provider if the pain does not go away. This information is not intended to replace advice given to you by your health care provider. Make sure you discuss any questions you have with your health care provider. Document Revised: 03/30/2020 Document Reviewed: 03/30/2020 Elsevier Patient Education  2023 Elsevier Inc.  

## 2021-11-09 NOTE — Progress Notes (Signed)
ROB doing well, feeling movement. State she is feeling more movement in low in her vagina. She is concerned the baby is breech. Leloplods difficult to determine, fetal HR  Clear in the left lower quadrant. Discussed Theme park manager and spinning babies. Discussed continued survalance and U/s if needed closer to due date as baby may move again . The ability to filp declines as the baby continues to get bigger. She verbalizes and agrees to plan. Follow up 2 wks.   Philip Aspen, CNM

## 2021-11-13 ENCOUNTER — Encounter: Payer: Medicaid Other | Admitting: Advanced Practice Midwife

## 2021-11-23 ENCOUNTER — Ambulatory Visit (INDEPENDENT_AMBULATORY_CARE_PROVIDER_SITE_OTHER): Payer: Medicaid Other | Admitting: Advanced Practice Midwife

## 2021-11-23 ENCOUNTER — Encounter: Payer: Self-pay | Admitting: Advanced Practice Midwife

## 2021-11-23 VITALS — BP 114/71 | Wt 164.0 lb

## 2021-11-23 DIAGNOSIS — Z3483 Encounter for supervision of other normal pregnancy, third trimester: Secondary | ICD-10-CM

## 2021-11-23 DIAGNOSIS — Z3A34 34 weeks gestation of pregnancy: Secondary | ICD-10-CM

## 2021-11-23 DIAGNOSIS — F3181 Bipolar II disorder: Secondary | ICD-10-CM

## 2021-11-23 DIAGNOSIS — M549 Dorsalgia, unspecified: Secondary | ICD-10-CM

## 2021-11-23 DIAGNOSIS — O26893 Other specified pregnancy related conditions, third trimester: Secondary | ICD-10-CM

## 2021-11-23 DIAGNOSIS — F411 Generalized anxiety disorder: Secondary | ICD-10-CM

## 2021-11-23 DIAGNOSIS — O99343 Other mental disorders complicating pregnancy, third trimester: Secondary | ICD-10-CM

## 2021-11-23 LAB — POCT URINALYSIS DIPSTICK OB
Glucose, UA: NEGATIVE
POC,PROTEIN,UA: NEGATIVE

## 2021-11-23 NOTE — Patient Instructions (Signed)
Managing Anxiety, Adult After being diagnosed with anxiety, you may be relieved to know why you have felt or behaved a certain way. You may also feel overwhelmed about the treatment ahead and what it will mean for your life. With care and support, you can manage this condition. How to manage lifestyle changes Managing stress and anxiety  Stress is your body's reaction to life changes and events, both good and bad. Most stress will last just a few hours, but stress can be ongoing and can lead to more than just stress. Although stress can play a major role in anxiety, it is not the same as anxiety. Stress is usually caused by something external, such as a deadline, test, or competition. Stress normally passes after the triggering event has ended.  Anxiety is caused by something internal, such as imagining a terrible outcome or worrying that something will go wrong that will devastate you. Anxiety often does not go away even after the triggering event is over, and it can become long-term (chronic) worry. It is important to understand the differences between stress and anxiety and to manage your stress effectively so that it does not lead to an anxious response. Talk with your health care provider or a counselor to learn more about reducing anxiety and stress. He or she may suggest tension reduction techniques, such as: Music therapy. Spend time creating or listening to music that you enjoy and that inspires you. Mindfulness-based meditation. Practice being aware of your normal breaths while not trying to control your breathing. It can be done while sitting or walking. Centering prayer. This involves focusing on a word, phrase, or sacred image that means something to you and brings you peace. Deep breathing. To do this, expand your stomach and inhale slowly through your nose. Hold your breath for 3-5 seconds. Then exhale slowly, letting your stomach muscles relax. Self-talk. Learn to notice and identify  thought patterns that lead to anxiety reactions and change those patterns to thoughts that feel peaceful. Muscle relaxation. Taking time to tense muscles and then relax them. Choose a tension reduction technique that fits your lifestyle and personality. These techniques take time and practice. Set aside 5-15 minutes a day to do them. Therapists can offer counseling and training in these techniques. The training to help with anxiety may be covered by some insurance plans. Other things you can do to manage stress and anxiety include: Keeping a stress diary. This can help you learn what triggers your reaction and then learn ways to manage your response. Thinking about how you react to certain situations. You may not be able to control everything, but you can control your response. Making time for activities that help you relax and not feeling guilty about spending your time in this way. Doing visual imagery. This involves imagining or creating mental pictures to help you relax. Practicing yoga. Through yoga poses, you can lower tension and promote relaxation.  Medicines Medicines can help ease symptoms. Medicines for anxiety include: Antidepressant medicines. These are usually prescribed for long-term daily control. Anti-anxiety medicines. These may be added in severe cases, especially when panic attacks occur. Medicines will be prescribed by a health care provider. When used together, medicines, psychotherapy, and tension reduction techniques may be the most effective treatment. Relationships Relationships can play a big part in helping you recover. Try to spend more time connecting with trusted friends and family members. Consider going to couples counseling if you have a partner, taking family education classes, or going to   family therapy. Therapy can help you and others better understand your condition. How to recognize changes in your anxiety Everyone responds differently to treatment for  anxiety. Recovery from anxiety happens when symptoms decrease and stop interfering with your daily activities at home or work. This may mean that you will start to: Have better concentration and focus. Worry will interfere less in your daily thinking. Sleep better. Be less irritable. Have more energy. Have improved memory. It is also important to recognize when your condition is getting worse. Contact your health care provider if your symptoms interfere with home or work and you feel like your condition is not improving. Follow these instructions at home: Activity Exercise. Adults should do the following: Exercise for at least 150 minutes each week. The exercise should increase your heart rate and make you sweat (moderate-intensity exercise). Strengthening exercises at least twice a week. Get the right amount and quality of sleep. Most adults need 7-9 hours of sleep each night. Lifestyle  Eat a healthy diet that includes plenty of vegetables, fruits, whole grains, low-fat dairy products, and lean protein. Do not eat a lot of foods that are high in fats, added sugars, or salt (sodium). Make choices that simplify your life. Do not use any products that contain nicotine or tobacco. These products include cigarettes, chewing tobacco, and vaping devices, such as e-cigarettes. If you need help quitting, ask your health care provider. Avoid caffeine, alcohol, and certain over-the-counter cold medicines. These may make you feel worse. Ask your pharmacist which medicines to avoid. General instructions Take over-the-counter and prescription medicines only as told by your health care provider. Keep all follow-up visits. This is important. Where to find support You can get help and support from these sources: Self-help groups. Online and community organizations. A trusted spiritual leader. Couples counseling. Family education classes. Family therapy. Where to find more information You may find  that joining a support group helps you deal with your anxiety. The following sources can help you locate counselors or support groups near you: Mental Health America: www.mentalhealthamerica.net Anxiety and Depression Association of America (ADAA): www.adaa.org National Alliance on Mental Illness (NAMI): www.nami.org Contact a health care provider if: You have a hard time staying focused or finishing daily tasks. You spend many hours a day feeling worried about everyday life. You become exhausted by worry. You start to have headaches or frequently feel tense. You develop chronic nausea or diarrhea. Get help right away if: You have a racing heart and shortness of breath. You have thoughts of hurting yourself or others. If you ever feel like you may hurt yourself or others, or have thoughts about taking your own life, get help right away. Go to your nearest emergency department or: Call your local emergency services (911 in the [place] Call a suicide crisis helpline, such as the National Suicide Prevention Lifeline at 1-800-273-8255 or 988 in the [place] This is open 24 hours a day in the [place] Text the Crisis Text Line at 741741 (in the [place] Summary Taking steps to learn and use tension reduction techniques can help calm you and help prevent triggering an anxiety reaction. When used together, medicines, psychotherapy, and tension reduction techniques may be the most effective treatment. Family, friends, and partners can play a big part in supporting you. This information is not intended to replace advice given to you by your health care provider. Make sure you discuss any questions you have with your health care provider. Document Revised: 08/10/2020 Document Reviewed: 05/08/2020 Elsevier   Patient Education  [place] and Anxiety Disorder Perinatal mood and anxiety disorder (PMAD) is a mental health condition that happens when a person feels excessive sadness, anger, or  worry and tension (anxiety) during pregnancy or during the first few months after the birth. This condition can last a few months or may continue for years if left untreated. PMAD may cause serious problems for the mother, her baby, or the father if not properly managed. Depression and anxiety can interfere with the ability to take care of the baby. It also may affect work, school, relationships, and other everyday activities. Having the baby blues is considered normal. Mild to moderate levels of sadness, exhaustion, and generally struggling with being a parent are considered the blues. Many parents experience these during the first 1-2 weeks after giving birth. If these symptoms become worse or last too long, it may be PMAD. What are the causes? The exact cause of this condition is not known. It may result from a combination of hormone changes and biological, social, and psychological factors. What increases the risk? The following factors may make you more likely to develop this condition: Having a personal or family history of depression, anxiety, or mood disorders. Experiencing a stressful life event during pregnancy, such as the death of a loved one. Having additional life stress, such as being a single parent. Having thyroid problems. What are the signs or symptoms? Symptoms of this condition include: Physical symptoms, such as: Panic attacks. These are intense episodes of fear or discomfort that may also cause sweating, nausea, shortness of breath, or fear of dying. They usually last 5-15 minutes but can last longer. Performing repetitive tasks to relieve stress or worry (obsessive compulsive disorder, or OCD). Problems with appetite or sleep. Emotional symptoms, such as: Excessive worry about problems or feeling like something bad will happen (generalized anxiety disorder). Phobias, which are fears of certain objects or situations. Separation anxiety, or fear and stress about leaving  certain people or loved ones. Behavioral symptoms, such as: Depression, or lack of motivation and energy. Intense mood swings involving emotional highs and lows. Feeling out of control or like you are going crazy. Having difficulty bonding with your baby. Some people also have trouble relaxing, problems concentrating, problems sleeping, frequent nightmares, and disturbing thoughts. PMAD can be different for everyone and can affect men as well as women. How is this diagnosed? This condition is diagnosed based on a physical exam and mental evaluation. In some cases, your health care provider may use an anxiety or depression screening tool. This includes a list of questions that can help a health care provider diagnose PMAD. You may be referred to a mental health expert who specializes in treating PMAD. How is this treated? This condition may be treated with: Talk therapy with a mental health professional. This may be family therapy, marriage therapy, cognitive behavioral therapy, or interpersonal therapy. Medicines. Your health care provider will discuss medicines that are safe to use during pregnancy and breastfeeding. Stress reduction therapies, such as mindfulness, deep breathing, or guided muscle relaxation. Support groups, early childhood education, or other groups to help with being a parent. Follow these instructions at home: Lifestyle Do not use any products that contain nicotine or tobacco. These products include cigarettes, chewing tobacco, and vaping devices, such as e-cigarettes. If you need help quitting, ask your health care provider. Do not drink alcohol when you are pregnant. It is also safest not to drink alcohol if you are breastfeeding.  After your baby is born, if you drink alcohol: Limit how much you have to 0-1 drink a day. Be aware of how much alcohol is in your drink. In the [place] one drink equals one 12 oz bottle of beer (355 mL), one 5 oz glass of wine (148 mL), or one  1 oz glass of hard liquor (44 mL). Consider joining a support group for new mothers. Ask your health care provider for recommendations. Take good care of yourself. Make sure you: Get as much rest as possible. Talk with your partner about sharing the responsibility of getting up with your baby if possible. Make sleep a priority. Eat a healthy diet. This includes plenty of fruits and vegetables, whole grains, and lean proteins. Exercise regularly, as told by your health care provider. Ask your health care provider what exercises are safe for you. Talk with your partner about making sure you both have opportunities to exercise. General instructions Take over-the-counter and prescription medicines only as told by your health care provider. Talk with your partner or family members about your feelings during pregnancy. Share your concerns, needs, or anxieties with each other. Do not be afraid to ask for help. Find a mental health professional, if needed. Ask for help with tasks or chores when you need it. Ask friends and family members to provide meals, watch your children, or help with cleaning. If friends or family are not able to help, consider finding a licensed child care provider or professional house cleaner if needed. Let your partner know what you need. He or she may be struggling too. Keep all follow-up visits. This is important. Contact a health care provider if: You or people close to you notice that you have symptoms of anxiety or depression. Your symptoms of anxiety or depression get worse. You take medicines and have side effects that are uncomfortable or difficult to tolerate. Get help right away if: You feel like hurting yourself, your baby, or someone else. If you feel like you may hurt yourself or others, or have thoughts about taking your own life, get help right away. Go to your nearest emergency department or: Call your local emergency services (911 in the [place] Call a suicide  crisis helpline, such as the [place] at 249-347-6197 or 988 in the [place]. This is open 24 hours a day in the [place] Text the Crisis Text Line at 512-869-8977 (in the [place].). Summary Perinatal mood and anxiety disorder (PMAD) is when a woman or her partner feels excessive sadness, anger, or worry and tension (anxiety) during pregnancy or during the first few months after the birth. PMAD may include depression, intense mood swings, panic attacks, separation anxiety, phobias, or generalized anxiety. PMAD can cause problems for the mother, the baby, or the father if not properly managed. This condition is treated with medicines, talk therapy, stress reduction therapies, or a combination of treatments. Talk with your partner or family members about your concerns or fears. Ask for help when you need it. This information is not intended to replace advice given to you by your health care provider. Make sure you discuss any questions you have with your health care provider. Document Revised: 08/10/2020 Document Reviewed: 07/10/2019 Elsevier Patient Education  [place].

## 2021-11-23 NOTE — Addendum Note (Signed)
Addended by: Quintella Baton D on: 11/23/2021 03:14 PM   Modules accepted: Orders

## 2021-11-23 NOTE — Progress Notes (Signed)
Routine Prenatal Care Visit  Subjective  Crystal Melendez is a 21 y.o. G3P2002 at [redacted]w[redacted]d being seen today for ongoing prenatal care.  She is currently monitored for the following issues for this low-risk pregnancy and has Bipolar 2 disorder ([facility]); Supervision of other normal pregnancy, antepartum; Back pain affecting pregnancy; Generalized anxiety disorder; and Family history of hypercholesterolemia on their problem list.  ----------------------------------------------------------------------------------- Patient reports generally doing well. She has some questions regarding her new Rx for zoloft. Discussion of side effects/withdrawal for newborn.   Contractions: Not present. Vag. Bleeding: None.  Movement: Present. Leaking Fluid denies.  ----------------------------------------------------------------------------------- The following portions of the patient's history were reviewed and updated as appropriate: allergies, current medications, past family history, past medical history, past social history, past surgical history and problem list. Problem list updated.  Objective  Blood pressure 114/71, weight 164 lb (74.4 kg), last menstrual period 03/29/2021 Pregravid weight 120 lb (54.4 kg) Total Weight Gain 44 lb (20 kg) Urinalysis: Urine Protein    Urine Glucose    Fetal Status: Fetal Heart Rate (bpm): 144 Fundal Height: 34 cm Movement: Present     General:  Alert, oriented and cooperative. Patient is in no acute distress.  Skin: Skin is warm and dry. No rash noted.   Cardiovascular: Normal heart rate noted  Respiratory: Normal respiratory effort, no problems with respiration noted  Abdomen: Soft, gravid, appropriate for gestational age. Pain/Pressure: Present     Pelvic:  Cervical exam deferred        Extremities: Normal range of motion.  Edema: None  Mental Status: Normal mood and affect. Normal behavior. Normal judgment and thought content.   Assessment   21 y.o. [id number] at [redacted]w[redacted]d by   01/03/2022, by Last Menstrual Period presenting for routine prenatal visit  Plan   g3 Problems (from 06/01/21 to present)    Problem Noted Resolved   Supervision of other normal pregnancy, antepartum 06/23/2021 by Cleophas Dunker, CMA No   [redacted] weeks gestation of pregnancy 08/19/2021 by Rod Can, CNM 09/23/2021 by Rod Can, CNM       Preterm labor symptoms and general obstetric precautions including but not limited to vaginal bleeding, contractions, leaking of fluid and fetal movement were reviewed in detail with the patient. Please refer to After Visit Summary for other counseling recommendations.   Return in about 2 weeks (around 12/07/2021) for rob.  Rod Can, CNM 11/23/2021 3:11 PM

## 2021-11-30 ENCOUNTER — Telehealth: Payer: Self-pay

## 2021-11-30 ENCOUNTER — Other Ambulatory Visit: Payer: Self-pay

## 2021-11-30 ENCOUNTER — Encounter: Payer: Self-pay | Admitting: Obstetrics and Gynecology

## 2021-11-30 ENCOUNTER — Inpatient Hospital Stay
Admission: EM | Admit: 2021-11-30 | Discharge: 2021-11-30 | Disposition: A | Discharge status: Home / Self Care | Payer: Medicaid Other | Attending: Obstetrics and Gynecology | Admitting: Obstetrics and Gynecology

## 2021-11-30 DIAGNOSIS — N898 Other specified noninflammatory disorders of vagina: Secondary | ICD-10-CM | POA: Diagnosis not present

## 2021-11-30 DIAGNOSIS — Z3A35 35 weeks gestation of pregnancy: Secondary | ICD-10-CM | POA: Insufficient documentation

## 2021-11-30 DIAGNOSIS — Z0371 Encounter for suspected problem with amniotic cavity and membrane ruled out: Secondary | ICD-10-CM | POA: Diagnosis present

## 2021-11-30 DIAGNOSIS — Z3480 Encounter for supervision of other normal pregnancy, unspecified trimester: Secondary | ICD-10-CM

## 2021-11-30 DIAGNOSIS — O26893 Other specified pregnancy related conditions, third trimester: Secondary | ICD-10-CM | POA: Diagnosis not present

## 2021-11-30 LAB — RUPTURE OF MEMBRANE (ROM)PLUS: Rom Plus: NEGATIVE

## 2021-11-30 NOTE — Discharge Planning (Signed)
RN reviewed discharge instructions with patient. RN gave patient opportunity for questions. All questions answered at this time. Pt verbalized understanding. Pt discharged home with her cousin

## 2021-11-30 NOTE — Telephone Encounter (Signed)
Pt calling; needs to know if she needs to be checked or not; 35wks.  [phone]  Pt states she hasn't felt the baby move normally since last night; has tightness that comes and goes but is not timeable; has had a watery d/c; is crampy and uncomfortable.  Adv to go to L&D via ER; Aracely notified.

## 2021-11-30 NOTE — Discharge Summary (Signed)
OB Triage Note  Patient ID: Crystal Melendez MRN: [id number] DOB/AGE: 2000/08/30 21 y.o.   History of Present Illness: The patient is a 21 y.o. female G3P2002 at [redacted]w[redacted]d who presents for concerns about water breaking. Was standing and felt  a large gush of fluid. Has not had continued leaking of fluid. Also reported decreased FM since last night although felt more movement since coming to OB/triage. Denies bleeding. Was having contractions but after drinking and resting contractions have subsided.   Past Medical History:  Diagnosis Date   ADHD    states when little; on no medication   Bipolar 1 disorder ([place])    states dx'ed years ago; no medication   Generalized anxiety disorder 10/06/2021    Past Surgical History:  Procedure Laterality Date   DENTAL SURGERY     all four age 71    No current facility-administered medications on file prior to encounter.   Current Outpatient Medications on File Prior to Encounter  Medication Sig Dispense Refill   hydrOXYzine (ATARAX) 25 MG tablet Take 25 mg by mouth 3 (three) times daily as needed for anxiety.     sertraline (ZOLOFT) 50 MG tablet      propranolol (INDERAL) 10 MG tablet Take 1 tablet (10 mg total) by mouth 2 (two) times daily as needed (anxiety). (Patient not taking: Reported on 11/30/2021) 30 tablet 1    Allergies  Allergen Reactions   Sulfa Antibiotics Rash    Social History   Socioeconomic History   Marital status: Married    Spouse name: Not on file   Number of children: 2   Years of education: 12   Highest education level: Not on file  Occupational History   Occupation: Ship broker  Tobacco Use   Smoking status: Never   Smokeless tobacco: Never  Vaping Use   Vaping Use: Former   Substances: Nicotine   Devices: last use one month ago  Substance and Sexual Activity   Alcohol use: Not Currently    Comment: last use 4 years ago   Drug use: Not Currently    Frequency: 3.0 times per week    Types: Marijuana    Comment:  last use 4 years ago   Sexual activity: Yes    Birth control/protection: Pill    Comment: nexplanon removed 4-6 months ago; last used progestin only pill before current pregnancy  Other Topics Concern   Not on file  Social History Narrative   Not on file   Social Determinants of Health   Financial Resource Strain: Low Risk  (06/23/2021)   Overall Financial Resource Strain (CARDIA)    Difficulty of Paying Living Expenses: Not hard at all  Food Insecurity: No Food Insecurity (06/23/2021)   Hunger Vital Sign    Worried About Running Out of Food in the Last Year: Never true    Ran Out of Food in the Last Year: Never true  Transportation Needs: No Transportation Needs (06/23/2021)   PRAPARE - Hydrologist (Medical): No    Lack of Transportation (Non-Medical): No  Physical Activity: Not on file  Stress: No Stress Concern Present (06/23/2021)   [place]    Feeling of Stress : Not at all  Social Connections: Moderately Integrated (06/23/2021)   Social Connection and Isolation Panel [NHANES]    Frequency of Communication with Friends and Family: Once a week    Frequency of Social Gatherings with Friends and Family: More  than three times a week    Attends Religious Services: 1 to 4 times per year    Active Member of Clubs or Organizations: No    Attends Banker Meetings: Never    Marital Status: Married  Catering manager Violence: Not At Risk (06/23/2021)   Humiliation, Afraid, Rape, and Kick questionnaire    Fear of Current or Ex-Partner: No    Emotionally Abused: No    Physically Abused: No    Sexually Abused: No    Family History  Problem Relation Age of Onset   Diabetes Maternal Grandmother    Bipolar disorder Maternal Grandmother      ROS   Physical Exam: BP 112/65 (BP Location: Left Arm)   Pulse (!) 111   Temp 98.3 F (36.8 C) (Oral)   Resp 16   Ht 5\' 1"  (1.549 m)    Wt 76.2 kg   LMP 03/29/2021 (Approximate)   SpO2 100%   BMI 31.74 kg/m   OBGyn Exam SVE offered, declined by patient  FHT 140s, mod variability, pos accels, no decels Toco Ucs q2-14min initially, then subsided before leaving  Assessment 21y/o G3P2 at 35wks Membranes intact Not in labor  Plan Discharge home, will f/u with ROB on 11/13 Labor precautions discussed Patient comfortable with plan  Discharge Instructions     Discharge activity:  No Restrictions   Complete by: As directed    Discharge diet:  No restrictions   Complete by: As directed    Discharge instructions   Complete by: As directed    Return to clinic for ROB on the 13th Labor precautions discussed      Allergies as of 11/30/2021       Reactions   Sulfa Antibiotics Rash        Medication List     TAKE these medications    hydrOXYzine 25 MG tablet Commonly known as: ATARAX Take 25 mg by mouth 3 (three) times daily as needed for anxiety.   propranolol 10 MG tablet Commonly known as: INDERAL Take 1 tablet (10 mg total) by mouth 2 (two) times daily as needed (anxiety).   Zoloft 50 MG tablet Generic drug: sertraline         Total time spent taking care of this patient: 20 minutes  Signed: 13/03/2021 CNM, FNP 11/30/2021, 5:57 PM

## 2021-11-30 NOTE — Telephone Encounter (Signed)
Pt calling; has questions about the vaccine for RSV.  [phone]  Adv pt we haven't heard anything about a vaccine for RSV that can be given to preg pts; we do not have it; adv pt to call the HD.

## 2021-11-30 NOTE — OB Triage Note (Signed)
Pt presents c/o decreased fetal movement since last night and LOF. Pt reports a gush of fluid run down her leg at work but nothing has come out since.  Pt denies bleeding. Pt reports being crampy and tight today. VSS. Will continue to monitor.

## 2021-12-01 ENCOUNTER — Telehealth: Payer: Self-pay

## 2021-12-01 NOTE — Telephone Encounter (Signed)
Patient was seen in ED yesterday. She said her job took her out of work for the rest of the week, when they found out she was at ED. She does not know why they took her out, but now they are requesting a letter from you stating that she can be out. She said she is suppose to go out on 12/08/2021 according to [place], but I see no documentation. Please advise if you want a letter sent or not.

## 2021-12-04 NOTE — Telephone Encounter (Signed)
I agree. I told her that we would probably not be giving her a note, but I had to type a note and send it to a provider per protocol. I guess.

## 2021-12-04 NOTE — Telephone Encounter (Signed)
Called patient to inform her that we would not write a note for her to go out of work early. No answer, LVM and sent a mychart.

## 2021-12-11 ENCOUNTER — Other Ambulatory Visit (HOSPITAL_COMMUNITY)
Admission: RE | Admit: 2021-12-11 | Discharge: 2021-12-11 | Disposition: A | Discharge status: Home / Self Care | Payer: Medicaid Other | Source: Ambulatory Visit | Attending: Certified Nurse Midwife | Admitting: Certified Nurse Midwife

## 2021-12-11 ENCOUNTER — Ambulatory Visit (INDEPENDENT_AMBULATORY_CARE_PROVIDER_SITE_OTHER): Payer: Medicaid Other | Admitting: Certified Nurse Midwife

## 2021-12-11 VITALS — BP 117/78 | HR 96 | Wt 166.8 lb

## 2021-12-11 DIAGNOSIS — Z3685 Encounter for antenatal screening for Streptococcus B: Secondary | ICD-10-CM

## 2021-12-11 DIAGNOSIS — Z113 Encounter for screening for infections with a predominantly sexual mode of transmission: Secondary | ICD-10-CM | POA: Diagnosis present

## 2021-12-11 DIAGNOSIS — Z23 Encounter for immunization: Secondary | ICD-10-CM

## 2021-12-11 DIAGNOSIS — Z3A36 36 weeks gestation of pregnancy: Secondary | ICD-10-CM

## 2021-12-11 DIAGNOSIS — Z3480 Encounter for supervision of other normal pregnancy, unspecified trimester: Secondary | ICD-10-CM

## 2021-12-11 LAB — POCT URINALYSIS DIPSTICK OB
Bilirubin, UA: NEGATIVE
Blood, UA: NEGATIVE
Glucose, UA: NEGATIVE
Ketones, UA: NEGATIVE
Leukocytes, UA: NEGATIVE
Nitrite, UA: NEGATIVE
POC,PROTEIN,UA: NEGATIVE
Spec Grav, UA: <=1.005 — AB (ref 1.010–1.025)
Urobilinogen, UA: 0.2 E.U./dL
pH, UA: 6 (ref 5.0–8.0)

## 2021-12-11 NOTE — Addendum Note (Signed)
Addended by: Fonda Kinder on: 12/11/2021 02:17 PM   Modules accepted: Orders

## 2021-12-11 NOTE — Progress Notes (Signed)
ROB doing well, feeling good movement. Labor precautions reviewed. Pt self swab for Gbs/cultures. Declines SVE. Follow up 1 wk for ROB.   Doreene Burke, CNM

## 2021-12-11 NOTE — Patient Instructions (Signed)

## 2021-12-13 LAB — CERVICOVAGINAL ANCILLARY ONLY
Chlamydia: NEGATIVE
Comment: NEGATIVE
Comment: NORMAL
Neisseria Gonorrhea: NEGATIVE

## 2021-12-15 LAB — CULTURE, BETA STREP (GROUP B ONLY): Strep Gp B Culture: NEGATIVE

## 2021-12-16 ENCOUNTER — Observation Stay
Admission: EM | Admit: 2021-12-16 | Discharge: 2021-12-16 | Disposition: A | Discharge status: Home / Self Care | Payer: Medicaid Other | Attending: Obstetrics | Admitting: Obstetrics

## 2021-12-16 ENCOUNTER — Other Ambulatory Visit: Payer: Self-pay

## 2021-12-16 ENCOUNTER — Encounter: Payer: Self-pay | Admitting: Obstetrics and Gynecology

## 2021-12-16 DIAGNOSIS — O471 False labor at or after 37 completed weeks of gestation: Secondary | ICD-10-CM | POA: Diagnosis present

## 2021-12-16 DIAGNOSIS — Z3480 Encounter for supervision of other normal pregnancy, unspecified trimester: Secondary | ICD-10-CM

## 2021-12-16 DIAGNOSIS — Z3A37 37 weeks gestation of pregnancy: Secondary | ICD-10-CM | POA: Diagnosis not present

## 2021-12-16 MED ORDER — LACTATED RINGERS IV SOLN: Freq: Once | INTRAVENOUS | Status: AC | PRN

## 2021-12-16 MED ORDER — ONDANSETRON HCL 4 MG/2ML IJ SOLN
4.0000 mg | Freq: Once | INTRAMUSCULAR | Status: AC
  Administered 2021-12-16: 4 mg via INTRAVENOUS
  Filled 2021-12-16: qty 2

## 2021-12-16 MED ORDER — ACETAMINOPHEN 325 MG PO TABS
650.0000 mg | ORAL_TABLET | ORAL | Status: DC | PRN
  Administered 2021-12-16: 650 mg via ORAL
  Filled 2021-12-16: qty 2

## 2021-12-16 NOTE — OB Triage Note (Signed)
Crystal Melendez 21 y.o. presents to Labor & Delivery triage via wheelchair steered by ED staff reporting contractions q 5-6 minutes. She is a G3P2002 at [redacted]w[redacted]d . She denies signs and symptoms consistent with rupture of membranes or active vaginal bleeding. She denies contractions and states positive fetal movement. External FM and TOCO applied to non-tender abdomen. Initial FHR 145. Vital signs obtained and within normal limits. Patient oriented to care environment including call bell and bed control use. Missy Swanson, CNM notified of patient's arrival.

## 2021-12-16 NOTE — OB Triage Note (Signed)
LABOR & DELIVERY OB TRIAGE NOTE  SUBJECTIVE  HPI Crystal Melendez is a 21 y.o. [id number] at [redacted]w[redacted]d who presents to Labor & Delivery for contractions. She reports that ctx started around 1850 and quickly became consistent. She denies LOF and vaginal bleeding and endorses good fetal movement. She denies UTI symptoms. She is feeling a little nauseated. She is taking Augmentin for a sinus infection. She stated that she has not had much to drink today. Contractions spaced out after [name] received a IV fluid bolus in triage.  OB History     Gravida  3   Para  2   Term  2   Preterm  0   AB  0   Living  2      SAB  0   IAB  0   Ectopic  0   Multiple  0   Live Births  2           Scheduled Meds:  ondansetron (ZOFRAN) IV  4 mg Intravenous Once   Continuous Infusions:  lactated ringers     PRN Meds:.acetaminophen, lactated ringers  OBJECTIVE  BP 115/71 (BP Location: Right Arm)   Pulse 97   Temp 97.8 F (36.6 C)   Resp 16   Ht 5\' 1"  (1.549 m)   Wt 75.7 kg   LMP 03/29/2021 (Approximate)   BMI 31.52 kg/m   General: alert, cooperative, anxious Heart: RRR Lungs: CTAB Abdomen: soft, non-tender, normal BS, gravid Cervical exam: Dilation: 1 Effacement (%): Thick Cervical Position: Posterior Station: Ballotable Presentation: Vertex Exam by:: Talya RN   NST I reviewed the NST and it was reactive.  Baseline: 155 Variability: moderate Accelerations: present Decelerations:none Toco: q 5 minutes Category 1  ASSESSMENT Impression  1) Pregnancy at [id number], [redacted]w[redacted]d, Estimated Date of Delivery: 01/03/22 2) Reassuring maternal/fetal status 3) No cervical change over 2 hours  PLAN  1) Discharge home with standard labor/return precautions 2) Encouraged rest, hydration, warm bath, Tylenol PM  3) Keep scheduled ROB appt  14/6/23, CNM 12/16/21 9:35 PM

## 2021-12-16 NOTE — Progress Notes (Signed)
Patient discharged home per order.  Patient is stable and ambulatory. After Visit Summary was given and reviewed with the patient, no questions or concerns voiced.

## 2021-12-18 ENCOUNTER — Ambulatory Visit (INDEPENDENT_AMBULATORY_CARE_PROVIDER_SITE_OTHER): Payer: Medicaid Other | Admitting: Certified Nurse Midwife

## 2021-12-18 ENCOUNTER — Encounter: Payer: Self-pay | Admitting: Certified Nurse Midwife

## 2021-12-18 VITALS — BP 109/75 | HR 106 | Wt 171.6 lb

## 2021-12-18 DIAGNOSIS — Z3483 Encounter for supervision of other normal pregnancy, third trimester: Secondary | ICD-10-CM

## 2021-12-18 DIAGNOSIS — Z3480 Encounter for supervision of other normal pregnancy, unspecified trimester: Secondary | ICD-10-CM

## 2021-12-18 DIAGNOSIS — Z3A37 37 weeks gestation of pregnancy: Secondary | ICD-10-CM

## 2021-12-18 MED ORDER — ONDANSETRON 4 MG PO TBDP: 4.0000 mg | ORAL_TABLET | Freq: Three times a day (TID) | ORAL | 0 refills | Status: DC | PRN

## 2021-12-18 NOTE — Progress Notes (Signed)
ROB. Patient states daily fetal movement and occasional braxton hicks. She reports starting on Saturday she has been nauseated, has vomited twice today. Patient states no additional questions or concerns at this time.

## 2021-12-18 NOTE — Progress Notes (Signed)
ROB doing well, has been having some nausea and vomiting. Zofran ordered. Reviewed labor precautions. Pt declines SVE today. She is feeling good movement. Discussed kick counts. Follow up 1 wk or prn .   Doreene Burke, CNM

## 2021-12-18 NOTE — Patient Instructions (Addendum)
Fetal Movement Counts Patient Name: ________________________________________________ Patient Due Date: ____________________ What is a fetal movement count?  A fetal movement count is the number of times that you feel your baby move during a certain amount of time. This may also be called a fetal kick count. A fetal movement count is recommended for every pregnant woman. You may be asked to start counting fetal movements as early as week 28 of your pregnancy. Pay attention to when your baby is most active. You may notice your baby's sleep and wake cycles. You may also notice things that make your baby move more. You should do a fetal movement count: When your baby is normally most active. At the same time each day. A good time to count movements is while you are resting, after having something to eat and drink. How do I count fetal movements? Find a quiet, comfortable area. Sit, or lie down on your side. Write down the date, the start time and stop time, and the number of movements that you felt between those two times. Take this information with you to your health care visits. Write down your start time when you feel the first movement. Count kicks, flutters, swishes, rolls, and jabs. You should feel at least 10 movements. You may stop counting after you have felt 10 movements, or if you have been counting for 2 hours. Write down the stop time. If you do not feel 10 movements in 2 hours, contact your health care provider for further instructions. Your health care provider may want to do additional tests to assess your baby's well-being. Contact a health care provider if: You feel fewer than 10 movements in 2 hours. Your baby is not moving like he or she usually does. Date: ____________ Start time: ____________ Stop time: ____________ Movements: ____________ Date: ____________ Start time: ____________ Stop time: ____________ Movements: ____________ Date: ____________ Start time: ____________ Stop  time: ____________ Movements: ____________ Date: ____________ Start time: ____________ Stop time: ____________ Movements: ____________ Date: ____________ Start time: ____________ Stop time: ____________ Movements: ____________ Date: ____________ Start time: ____________ Stop time: ____________ Movements: ____________ Date: ____________ Start time: ____________ Stop time: ____________ Movements: ____________ Date: ____________ Start time: ____________ Stop time: ____________ Movements: ____________ Date: ____________ Start time: ____________ Stop time: ____________ Movements: ____________ This information is not intended to replace advice given to you by your health care provider. Make sure you discuss any questions you have with your health care provider. Document Revised: 09/04/2018 Document Reviewed: 09/04/2018 Elsevier Patient Education  2023 [employer] [place] of the uterus can occur throughout pregnancy, but they are not always a sign that you are in labor. You may have practice contractions called Braxton Hicks contractions. These false labor contractions are sometimes confused with true labor. What are Deberah Pelton contractions? Braxton Hicks contractions are tightening movements that occur in the muscles of the uterus before labor. Unlike true labor contractions, these contractions do not result in opening (dilation) and thinning of the lowest part of the uterus (cervix). Toward the end of pregnancy (32-34 weeks), Braxton Hicks contractions can happen more often and may become stronger. These contractions are sometimes difficult to tell apart from true labor because they can be very uncomfortable. How to tell the difference between true labor and false labor True labor Contractions last 30-70 seconds. Contractions become very regular. Discomfort is usually felt in the top of the uterus, and it spreads to the lower abdomen and low back. Contractions do  not go away  with walking. Contractions usually become stronger and more frequent. The cervix dilates and gets thinner. False labor Contractions are usually shorter, weaker, and farther apart than true labor contractions. Contractions are usually irregular. Contractions are often felt in the front of the lower abdomen and in the groin. Contractions may go away when you walk around or change positions while lying down. The cervix usually does not dilate or become thin. Sometimes, the only way to tell if you are in true labor is for your health care provider to look for changes in your cervix. Your health care provider will do a physical exam and may monitor your contractions. If you are in true labor, your health care provider will send you home with instructions about when to return to the hospital. You may continue to have Braxton Hicks contractions until you go into true labor. Follow these instructions at home:  Take over-the-counter and prescription medicines only as told by your health care provider. If Braxton Hicks contractions are making you uncomfortable: Change your position from lying down or resting to walking, or change from walking to resting. Sit and rest in a tub of warm water. Drink enough fluid to keep your urine pale yellow. Dehydration may cause these contractions. Do slow and deep breathing several times an hour. Keep all follow-up visits. This is important. Contact a health care provider if: You have a fever. You have continuous pain in your abdomen. Your contractions become stronger, more regular, and closer together. You pass blood-tinged mucus. Get help right away if: You have fluid leaking or gushing from your vagina. You have bright red blood coming from your vagina. Your baby is not moving inside you as much as it used to. Summary You may have practice contractions called Braxton Hicks contractions. These false labor contractions are sometimes confused with  true labor. Braxton Hicks contractions are usually shorter, weaker, farther apart, and less regular than true labor contractions. True labor contractions usually become stronger, more regular, and more frequent. Manage discomfort from [facility] contractions by changing position, resting in a warm bath, practicing deep breathing, and drinking plenty of water. Keep all follow-up visits. Contact your health care provider if your contractions become stronger, more regular, and closer together. This information is not intended to replace advice given to you by your health care provider. Make sure you discuss any questions you have with your health care provider. Document Revised: 11/23/2019 Document Reviewed: 11/23/2019 Elsevier Patient Education  2023 [employer].

## 2021-12-26 ENCOUNTER — Encounter: Payer: Self-pay | Admitting: Obstetrics and Gynecology

## 2021-12-26 ENCOUNTER — Other Ambulatory Visit: Payer: Self-pay

## 2021-12-26 ENCOUNTER — Inpatient Hospital Stay
Admission: EM | Admit: 2021-12-26 | Discharge: 2021-12-28 | DRG: 807 | Disposition: A | Discharge status: Home / Self Care | Payer: Medicaid Other | Attending: Certified Nurse Midwife | Admitting: Certified Nurse Midwife

## 2021-12-26 DIAGNOSIS — Z3A38 38 weeks gestation of pregnancy: Secondary | ICD-10-CM

## 2021-12-26 DIAGNOSIS — O99344 Other mental disorders complicating childbirth: Principal | ICD-10-CM | POA: Diagnosis present

## 2021-12-26 DIAGNOSIS — F419 Anxiety disorder, unspecified: Secondary | ICD-10-CM | POA: Diagnosis present

## 2021-12-26 DIAGNOSIS — O99214 Obesity complicating childbirth: Secondary | ICD-10-CM | POA: Diagnosis not present

## 2021-12-26 DIAGNOSIS — O26893 Other specified pregnancy related conditions, third trimester: Secondary | ICD-10-CM | POA: Diagnosis present

## 2021-12-26 DIAGNOSIS — F319 Bipolar disorder, unspecified: Secondary | ICD-10-CM | POA: Diagnosis present

## 2021-12-26 DIAGNOSIS — E663 Overweight: Secondary | ICD-10-CM

## 2021-12-26 DIAGNOSIS — Z3480 Encounter for supervision of other normal pregnancy, unspecified trimester: Principal | ICD-10-CM

## 2021-12-26 DIAGNOSIS — O479 False labor, unspecified: Secondary | ICD-10-CM | POA: Diagnosis present

## 2021-12-26 LAB — CBC
HCT: 38.1 % (ref 36.0–46.0)
Hemoglobin: 13.1 g/dL (ref 12.0–15.0)
MCH: 30.7 pg (ref 26.0–34.0)
MCHC: 34.4 g/dL (ref 30.0–36.0)
MCV: 89.2 fL (ref 80.0–100.0)
Platelets: 319 10*3/uL (ref 150–400)
RBC: 4.27 MIL/uL (ref 3.87–5.11)
RDW: 12.1 % (ref 11.5–15.5)
WBC: 9.3 10*3/uL (ref 4.0–10.5)
nRBC: 0 % (ref 0.0–0.2)

## 2021-12-26 LAB — TYPE AND SCREEN
ABO/RH(D): O POS
Antibody Screen: NEGATIVE

## 2021-12-26 MED ORDER — ONDANSETRON HCL 4 MG/2ML IJ SOLN: 4.0000 mg | INTRAMUSCULAR | Status: DC | PRN

## 2021-12-26 MED ORDER — ACETAMINOPHEN 500 MG PO TABS
1000.0000 mg | ORAL_TABLET | Freq: Four times a day (QID) | ORAL | Status: DC
  Administered 2021-12-27 – 2021-12-28 (×6): 1000 mg via ORAL
  Filled 2021-12-26 (×7): qty 2

## 2021-12-26 MED ORDER — SERTRALINE HCL 100 MG PO TABS
100.0000 mg | ORAL_TABLET | Freq: Every day | ORAL | Status: DC
  Administered 2021-12-27 – 2021-12-28 (×2): 100 mg via ORAL
  Filled 2021-12-26 (×3): qty 1

## 2021-12-26 MED ORDER — DIBUCAINE (PERIANAL) 1 % EX OINT: 1.0000 | TOPICAL_OINTMENT | CUTANEOUS | Status: DC | PRN

## 2021-12-26 MED ORDER — MISOPROSTOL 200 MCG PO TABS
ORAL_TABLET | ORAL | Status: AC
  Filled 2021-12-26: qty 4

## 2021-12-26 MED ORDER — DIPHENHYDRAMINE HCL 25 MG PO CAPS: 25.0000 mg | ORAL_CAPSULE | Freq: Four times a day (QID) | ORAL | Status: DC | PRN

## 2021-12-26 MED ORDER — OXYTOCIN BOLUS FROM INFUSION
333.0000 mL | Freq: Once | INTRAVENOUS | Status: AC
  Administered 2021-12-26: 333 mL via INTRAVENOUS

## 2021-12-26 MED ORDER — AMMONIA AROMATIC IN INHA
RESPIRATORY_TRACT | Status: AC
  Filled 2021-12-26: qty 10

## 2021-12-26 MED ORDER — ZOLPIDEM TARTRATE 5 MG PO TABS: 5.0000 mg | ORAL_TABLET | Freq: Every evening | ORAL | Status: DC | PRN

## 2021-12-26 MED ORDER — WITCH HAZEL-GLYCERIN EX PADS: 1.0000 | MEDICATED_PAD | CUTANEOUS | Status: DC | PRN

## 2021-12-26 MED ORDER — PRENATAL MULTIVITAMIN CH
1.0000 | ORAL_TABLET | Freq: Every day | ORAL | Status: DC
  Administered 2021-12-27 – 2021-12-28 (×2): 1 via ORAL
  Filled 2021-12-26 (×2): qty 1

## 2021-12-26 MED ORDER — LIDOCAINE HCL (PF) 1 % IJ SOLN: 30.0000 mL | INTRAMUSCULAR | Status: DC | PRN

## 2021-12-26 MED ORDER — HYDROXYZINE HCL 25 MG PO TABS: 50.0000 mg | ORAL_TABLET | Freq: Four times a day (QID) | ORAL | Status: DC | PRN

## 2021-12-26 MED ORDER — LACTATED RINGERS IV SOLN: 500.0000 mL | INTRAVENOUS | Status: DC | PRN

## 2021-12-26 MED ORDER — SIMETHICONE 80 MG PO CHEW: 80.0000 mg | CHEWABLE_TABLET | ORAL | Status: DC | PRN

## 2021-12-26 MED ORDER — ONDANSETRON HCL 4 MG PO TABS: 4.0000 mg | ORAL_TABLET | ORAL | Status: DC | PRN

## 2021-12-26 MED ORDER — BENZOCAINE-MENTHOL 20-0.5 % EX AERO: 1.0000 | INHALATION_SPRAY | CUTANEOUS | Status: DC | PRN

## 2021-12-26 MED ORDER — FENTANYL-BUPIVACAINE-NACL 0.5-0.125-0.9 MG/250ML-% EP SOLN
EPIDURAL | Status: AC
  Filled 2021-12-26: qty 250

## 2021-12-26 MED ORDER — LIDOCAINE HCL (PF) 1 % IJ SOLN
INTRAMUSCULAR | Status: AC
  Filled 2021-12-26: qty 30

## 2021-12-26 MED ORDER — IBUPROFEN 600 MG PO TABS
600.0000 mg | ORAL_TABLET | Freq: Four times a day (QID) | ORAL | Status: DC
  Administered 2021-12-27 – 2021-12-28 (×7): 600 mg via ORAL
  Filled 2021-12-26 (×7): qty 1

## 2021-12-26 MED ORDER — DOCUSATE SODIUM 100 MG PO CAPS
100.0000 mg | ORAL_CAPSULE | Freq: Two times a day (BID) | ORAL | Status: DC
  Administered 2021-12-27 – 2021-12-28 (×4): 100 mg via ORAL
  Filled 2021-12-26 (×4): qty 1

## 2021-12-26 MED ORDER — LACTATED RINGERS IV SOLN: INTRAVENOUS | Status: DC

## 2021-12-26 MED ORDER — FENTANYL CITRATE (PF) 100 MCG/2ML IJ SOLN
50.0000 ug | INTRAMUSCULAR | Status: DC | PRN
  Administered 2021-12-26: 100 ug via INTRAVENOUS
  Filled 2021-12-26: qty 2

## 2021-12-26 MED ORDER — COCONUT OIL OIL: 1.0000 | TOPICAL_OIL | Status: DC | PRN

## 2021-12-26 MED ORDER — OXYTOCIN-SODIUM CHLORIDE 30-0.9 UT/500ML-% IV SOLN
2.5000 [IU]/h | INTRAVENOUS | Status: DC
  Administered 2021-12-26: 2.5 [IU]/h via INTRAVENOUS
  Filled 2021-12-26: qty 500

## 2021-12-26 MED ORDER — SOD CITRATE-CITRIC ACID 500-334 MG/5ML PO SOLN: 30.0000 mL | ORAL | Status: DC | PRN

## 2021-12-26 MED ORDER — OXYTOCIN 10 UNIT/ML IJ SOLN
INTRAMUSCULAR | Status: AC
  Filled 2021-12-26: qty 2

## 2021-12-26 MED ORDER — ONDANSETRON HCL 4 MG/2ML IJ SOLN: 4.0000 mg | Freq: Four times a day (QID) | INTRAMUSCULAR | Status: DC | PRN

## 2021-12-26 NOTE — H&P (Signed)
OB History & Physical   History of Present Illness:  Chief Complaint:   HPI:  ANISHA STARLIPER is a 21 y.o. G80P2002 female at [redacted]w[redacted]d dated by LMP.  She presents to L&D for labor. Contractions started around 2pm, they were not very close together at that time. She able to shower and pick her kids up from daycare. Since arriving to the unit the contractions have become closer and stronger. She has seen bloody show denies leaking of fluid.  She started prenatal care at 14wks, she has had frequent visits. Her pregnancy has been complicated by the list below.    Pregnancy Issues: 1. BMI 33 2. Bipolar disorder 3. Anxiety   Maternal Medical History:   Past Medical History:  Diagnosis Date   ADHD    states when little; on no medication   Bipolar 1 disorder (HCC)    states dx'ed years ago; no medication   Generalized anxiety disorder 10/06/2021    Past Surgical History:  Procedure Laterality Date   DENTAL SURGERY     all four age 59    Allergies  Allergen Reactions   Sulfa Antibiotics Rash    Prior to Admission medications   Medication Sig Start Date End Date Taking? Authorizing Provider  Prenatal Vit-Fe Fumarate-FA (MULTIVITAMIN-PRENATAL) 27-0.8 MG TABS tablet Take 1 tablet by mouth daily at 12 noon.   Yes [provider]  sertraline (ZOLOFT) 50 MG tablet  11/21/21  Yes [provider]  amoxicillin-clavulanate (AUGMENTIN XR) 1000-62.5 MG 12 hr tablet Take 2 tablets by mouth 2 (two) times daily.    [provider]  hydrOXYzine (ATARAX) 25 MG tablet Take 25 mg by mouth 3 (three) times daily as needed for anxiety.    [provider]  ondansetron (ZOFRAN-ODT) 4 MG disintegrating tablet Take 1 tablet (4 mg total) by mouth every 8 (eight) hours as needed for nausea or vomiting. 12/18/21   Doreene Burke, CNM     Prenatal care site:  OB GYN   Social History: She  reports that she has never smoked. She has never used smokeless tobacco. She  reports that she does not currently use alcohol. She reports that she does not currently use drugs after having used the following drugs: Marijuana. Frequency: 3.00 times per week.  Family History: family history includes Bipolar disorder in her maternal grandmother; Diabetes in her maternal grandmother.   Review of Systems: A full review of systems was performed and negative except as noted in the HPI.     Physical Exam:  Vital Signs: BP 121/72 (BP Location: Left Arm)   Pulse (!) 108   Temp 97.8 F (36.6 C) (Oral)   Resp 18   Ht 5\' 1"  (1.549 m)   Wt 79.8 kg   LMP 03/29/2021 (Approximate)   BMI 33.25 kg/m  General: no acute distress.  HEENT: normocephalic, atraumatic Heart: regular rate & rhythm.  No murmurs/rubs/gallops Lungs: clear to auscultation bilaterally, normal respiratory effort Abdomen: soft, gravid, non-tender;  EFW: 7.5 Pelvic:   External: Normal external female genitalia  Cervix: Dilation: 5.5 / Effacement (%): 70 / Station: -3    Extremities: non-tender, symmetric, NO  edema bilaterally.   Neurologic: Alert & oriented x 3.    No results found for this or any previous visit (from the past 24 hour(s)).  Pertinent Results:  Prenatal Labs: Blood type/Rh O +  Antibody screen neg  Rubella Immune  Varicella Immune  RPR NR  HBsAg Neg  HIV NR  GC neg  Chlamydia neg  Genetic screening negative  1 hour GTT 106  3 hour GTT   GBS neg   TGG:YIRSWNIO 135, moderate variability, neg accel, neg decel TOCO:q 1-5  SVE:  Dilation: 5.5 / Effacement (%): 70 / Station: -3    Cephalic by leopolds  No results found.  Assessment:  SULMA RUFFINO is a 20 y.o. G60P2002 female at [redacted]w[redacted]d with labor.   Plan:  Admit to Labor & Delivery CBC, T&S, Clrs, IVF GBS  neg, membranes intact  Consents obtained. Continuous efm/toco Pain management: desires IV or Nitrous   ----- Carie Caddy, CNM  [facility] [place]

## 2021-12-26 NOTE — Discharge Summary (Signed)
Obstetrical Discharge Summary  Date of Admission: 12/26/2021 Date of Discharge: 12/28/2021  Primary OB: [facility]   Gestational Age at Delivery: [redacted]w[redacted]d   Antepartum complications: BMI 33, Bipolar disorder, Anxiety  Reason for Admission: labor Date of Delivery: 12/26/2021  Delivered By: Siri Cole, CNM  Delivery Type: spontaneous vaginal delivery Intrapartum complications/course: None Anesthesia:  fentanyl x1  Placenta: Delivered and expressed via active management. Intact: yes. To pathology: no.  Laceration: none  Episiotomy: none EBL: Baby: Liveborn female, APGARs 8/9, weight 3060 g, 6 pounds 12 ounces   Discharge Diagnosis: Delivered.   Postpartum course: routine course; she is tolerating regular diet, her pain is controlled with PO medication, she is ambulating and voiding without difficulty, she reports breastfeeding is going well and she reports her mood is stable.  Discharge Vital Signs:  Current Vital Signs 24h Vital Sign Ranges  T 97.7 F (36.5 C) Temp  Avg: 98 F (36.7 C)  Min: 97.7 F (36.5 C)  Max: 98.3 F (36.8 C)  BP 111/78 BP  Min: 107/71  Max: 111/78  HR 93 Pulse  Avg: 86.3  Min: 75  Max: 93  RR 18 Resp  Avg: 18.7  Min: 18  Max: 20  SaO2 99 % (Room Air) Room Air SpO2  Avg: 99.7 %  Min: 99 %  Max: 100 %       24 Hour I/O Current Shift I/O  Time Ins Outs No intake/output data recorded. No intake/output data recorded.     Patient Vitals for the past 6 hrs:  BP Temp Temp src Pulse Resp SpO2  12/28/21 0814 111/78 97.7 F (36.5 C) Oral 93 18 99 %    Discharge Exam:  NAD Perineum: intact Abdomen: firm fundus below the umbilicus, NTTP, non distended, +bowel sounds.  Incision NA RRR no MRGs CTAB Ext: no c/c/e  Recent Labs  Lab 12/26/21 1928 12/27/21 0622  WBC 9.3 10.1  HGB 13.1 11.4*  HCT 38.1 33.8*  PLT 319 263    Disposition: Home  Rh Immune globulin given: no Rubella vaccine given: no Tdap vaccine given in AP or PP setting:  yes Flu vaccine given in AP or PP setting: yes  Contraception:  Nexplanon or POPs   Prenatal/Postnatal Panel: O POS//Rubella Immune//Varicella Immune//RPR negative//HIV negative/HepB Surface Ag negative//pap  (never collected d/t age)//plans to breastfeed  Plan:  Crystal Melendez was discharged to home in good condition. Follow-up appointment with LMD  in 2 weeks and 6 wks for a PP visit  No future appointments.   Discharge Medications: Allergies as of 12/28/2021       Reactions   Sulfa Antibiotics Rash        Medication List     STOP taking these medications    amoxicillin-clavulanate 1000-62.5 MG 12 hr tablet Commonly known as: AUGMENTIN XR   ondansetron 4 MG disintegrating tablet Commonly known as: ZOFRAN-ODT       TAKE these medications    hydrOXYzine 25 MG tablet Commonly known as: ATARAX Take 25 mg by mouth 3 (three) times daily as needed for anxiety.   multivitamin-prenatal 27-0.8 MG Tabs tablet Take 1 tablet by mouth daily at 12 noon.   Zoloft 50 MG tablet Generic drug: sertraline        Parke Poisson, CNM [facility] 12/28/2021, 10:19 AM

## 2021-12-27 ENCOUNTER — Encounter: Payer: Self-pay | Admitting: Obstetrics and Gynecology

## 2021-12-27 ENCOUNTER — Encounter: Payer: Medicaid Other | Admitting: Obstetrics

## 2021-12-27 LAB — CBC
HCT: 33.8 % — ABNORMAL LOW (ref 36.0–46.0)
Hemoglobin: 11.4 g/dL — ABNORMAL LOW (ref 12.0–15.0)
MCH: 30.9 pg (ref 26.0–34.0)
MCHC: 33.7 g/dL (ref 30.0–36.0)
MCV: 91.6 fL (ref 80.0–100.0)
Platelets: 263 10*3/uL (ref 150–400)
RBC: 3.69 MIL/uL — ABNORMAL LOW (ref 3.87–5.11)
RDW: 12.4 % (ref 11.5–15.5)
WBC: 10.1 10*3/uL (ref 4.0–10.5)
nRBC: 0 % (ref 0.0–0.2)

## 2021-12-27 LAB — RPR: RPR Ser Ql: NONREACTIVE

## 2021-12-27 LAB — RAPID HIV SCREEN (HIV 1/2 AB+AG)
HIV 1/2 Antibodies: NONREACTIVE
HIV-1 P24 Antigen - HIV24: NONREACTIVE

## 2021-12-27 NOTE — Lactation Note (Signed)
This note was copied from a baby's chart. Lactation Consultation Note  Patient Name: Girl Jendaya Gossett LSLHT'D Date: 12/27/2021 Reason for consult: Follow-up assessment Age:21 hours  Maternal Data See initial consult. Has patient been taught Hand Expression?: Yes Does the patient have breastfeeding experience prior to this delivery?: Yes How long did the patient breastfeed?: Mom breastfed her 2nd child for 6 months  Feeding Mother's Current Feeding Choice: Breast Milk Observed mother independently breastfeeding. Checked latch: lips are flanged outward on to areola . Baby with multiple swallows.  LATCH Score Latch: Grasps breast easily, tongue down, lips flanged, rhythmical sucking.  Audible Swallowing: Spontaneous and intermittent  Type of Nipple: Everted at rest and after stimulation  Comfort (Breast/Nipple): Soft / non-tender  Hold (Positioning): No assistance needed to correctly position infant at breast.  LATCH Score: 10  Interventions Interventions: Breast feeding basics reviewed;Education  Discharge Pump:  (Mother does not have a breastpump as of yet, she would like a Harmony manual pump.She prefers to get instruction on manual pump tomorrow.)  Consult Status Consult Status: Follow-up Date: 12/27/21 Follow-up type: In-patient  Update provided to care nurse.  Fuller Song 12/27/2021, 1:26 PM

## 2021-12-27 NOTE — Progress Notes (Signed)
Progress Note - Vaginal Delivery  Crystal Melendez is a 21 y.o. [phone] now PP day 1 s/p Vaginal, Spontaneous .   Subjective:  The patient reports no complaints, up ad lib, voiding, and tolerating PO   Objective:  Vital signs in last 24 hours: Temp:  [97.3 F (36.3 C)-98.2 F (36.8 C)] 98.2 F (36.8 C) (11/29 0803) Pulse Rate:  [73-118] 79 (11/29 0803) Resp:  [18-20] 18 (11/29 0803) BP: (101-121)/(66-72) 116/72 (11/29 0803) SpO2:  [98 %-100 %] 99 % (11/29 0803) Weight:  [79.8 kg] 79.8 kg (11/28 1656)  Physical Exam:  General: alert, cooperative, appears stated age, and fatigued Lochia: appropriate Uterine Fundus: firm @U -1    Data Review Recent Labs    12/26/21 1928 12/27/21 0622  HGB 13.1 11.4*  HCT 38.1 33.8*    Assessment/Plan: Principal Problem:   Uterine contractions   Plan for discharge tomorrow and Breastfeeding   -- Continue routine PP care.     12/29/21, CNM  12/27/2021 9:37 AM

## 2021-12-27 NOTE — Lactation Note (Signed)
This note was copied from a baby's chart. Lactation Consultation Note  Patient Name: Crystal Melendez [id number] Date: 12/27/2021 Reason for consult: Initial assessment;Mother's request;Early term 37-38.6wks Age:21 hours  Maternal Data This is Mom's third baby, SVD.Mom with a history of ADHD, Bipolar 1, and Generalized Anxiety Disorder. Mom is an experienced breastfeeding Mother and plans to breastfeed as long as long baby desires to breastfeed. Has patient been taught Hand Expression?: Yes (Mother reports knowing how to hand express; prior breastfeeding experience.) Does the patient have breastfeeding experience prior to this delivery?: Yes How long did the patient breastfeed?: Mom breast fed for 6 mo with last child  Feeding Mother's Current Feeding Choice: Breast Milk No feed was observed as baby was asleep skin to skin on Mother's chest. LC left the number for contacting for further assistance.  Interventions Interventions: Breast feeding basics reviewed;Education  Discharge Pump:  (Mom reports not having a pump as of yet, but requested a manual pump to take home.)  Consult Status Consult Status: Follow-up Date: 12/27/21 Follow-up type: In-patient Update provided to Care Nurse   Jodelle Gross 12/27/2021, 11:56 AM

## 2021-12-28 NOTE — Progress Notes (Signed)
Patient discharged home with infant. Discharge instructions and prescriptions given and reviewed with patient. Patient verbalized understanding. Escorted out by volunteer.  

## 2021-12-28 NOTE — Discharge Instructions (Signed)
Discharge Instructions:   If there are any new medications, they have been ordered and will be available for pickup at the listed pharmacy on your way home from the hospital.   Call office if you have any of the following: headache, visual changes, fever >101.0 F, chills, shortness of breath, breast concerns, excessive vaginal bleeding, incision drainage or problems, leg pain or redness, depression or any other concerns. If you have vaginal discharge with an odor, let your doctor know.   It is normal to bleed for up to 6 weeks. You should not soak through more than 1 pad in 1 hour. If you have a blood clot larger than your fist with continued bleeding, call your doctor.   After a c-section, you should expect a small amount of blood or clear fluid coming from the incision and abdominal cramping/soreness. Inspect your incision site daily. Stand in front of a mirror to look for any redness, incision opening, or discolored/odorness drainage. Take a shower daily and continue good hygiene. Use own towel and washcloth (do not share). Make sure your sheets on your bed are clean. No pets sleeping around your incision site. Dressing will be removed at your postpartum visit. If the dressing does become wet or soiled underneath, it is okay to remove it.   On-Q pump: You will remove on day 5 after insertion or if the ball becomes flat before day 5. You will remove on: Jun 01, 2018  Activity: Do not lift > 10 lbs for 6 weeks (do not lift anything heavier than your baby). No intercourse, tampons, swimming pools, hot tubs, baths (only showers) for 6 weeks.  No driving for 1-2 weeks. Continue prenatal vitamin, especially if breastfeeding. Increase calories and fluids (water) while breastfeeding.   Your milk will come in, in the next couple of days (right now it is colostrum). You may have a slight fever when your milk comes in, but it should go away on its own.  If it does not, and rises above 101 F please call the  doctor. You will also feel achy and your breasts will be firm. They will also start to leak. If you are breastfeeding, continue as you have been and you can pump/express milk for comfort.   If you have too much milk, your breasts can become engorged, which could lead to mastitis. This is an infection of the milk ducts. It can be very painful and you will need to notify your doctor to obtain a prescription for antibiotics. You can also treat it with a shower or hot/cold compress.   For concerns about your baby, please call your pediatrician.  For breastfeeding concerns, the lactation consultant can be reached at [phone].   Postpartum blues (feelings of happy one minute and sad another minute) are normal for the first few weeks but if it gets worse let your doctor know.   Congratulations! We enjoyed caring for you and your new bundle of joy!  

## 2021-12-31 ENCOUNTER — Other Ambulatory Visit: Payer: Self-pay

## 2021-12-31 ENCOUNTER — Emergency Department: Payer: Medicaid Other

## 2021-12-31 ENCOUNTER — Emergency Department
Admission: EM | Admit: 2021-12-31 | Discharge: 2021-12-31 | Disposition: A | Discharge status: Home / Self Care | Payer: Medicaid Other | Attending: Emergency Medicine | Admitting: Emergency Medicine

## 2021-12-31 DIAGNOSIS — E878 Other disorders of electrolyte and fluid balance, not elsewhere classified: Secondary | ICD-10-CM | POA: Diagnosis not present

## 2021-12-31 DIAGNOSIS — H538 Other visual disturbances: Secondary | ICD-10-CM | POA: Insufficient documentation

## 2021-12-31 DIAGNOSIS — R519 Headache, unspecified: Secondary | ICD-10-CM

## 2021-12-31 DIAGNOSIS — O99893 Other specified diseases and conditions complicating puerperium: Secondary | ICD-10-CM | POA: Insufficient documentation

## 2021-12-31 DIAGNOSIS — O99285 Endocrine, nutritional and metabolic diseases complicating the puerperium: Secondary | ICD-10-CM | POA: Insufficient documentation

## 2021-12-31 LAB — CBC WITH DIFFERENTIAL/PLATELET
Abs Immature Granulocytes: 0.04 10*3/uL (ref 0.00–0.07)
Basophils Absolute: 0.1 10*3/uL (ref 0.0–0.1)
Basophils Relative: 1 %
Eosinophils Absolute: 0.3 10*3/uL (ref 0.0–0.5)
Eosinophils Relative: 3 %
HCT: 37.4 % (ref 36.0–46.0)
Hemoglobin: 12.3 g/dL (ref 12.0–15.0)
Immature Granulocytes: 0 %
Lymphocytes Relative: 26 %
Lymphs Abs: 3.2 10*3/uL (ref 0.7–4.0)
MCH: 30.4 pg (ref 26.0–34.0)
MCHC: 32.9 g/dL (ref 30.0–36.0)
MCV: 92.6 fL (ref 80.0–100.0)
Monocytes Absolute: 0.7 10*3/uL (ref 0.1–1.0)
Monocytes Relative: 6 %
Neutro Abs: 7.9 10*3/uL — ABNORMAL HIGH (ref 1.7–7.7)
Neutrophils Relative %: 64 %
Platelets: 327 10*3/uL (ref 150–400)
RBC: 4.04 MIL/uL (ref 3.87–5.11)
RDW: 12.1 % (ref 11.5–15.5)
WBC: 12.2 10*3/uL — ABNORMAL HIGH (ref 4.0–10.5)
nRBC: 0 % (ref 0.0–0.2)

## 2021-12-31 LAB — COMPREHENSIVE METABOLIC PANEL
ALT: 33 U/L (ref 0–44)
AST: 23 U/L (ref 15–41)
Albumin: 3.3 g/dL — ABNORMAL LOW (ref 3.5–5.0)
Alkaline Phosphatase: 109 U/L (ref 38–126)
Anion gap: 6 (ref 5–15)
BUN: 11 mg/dL (ref 6–20)
CO2: 20 mmol/L — ABNORMAL LOW (ref 22–32)
Calcium: 9 mg/dL (ref 8.9–10.3)
Chloride: 112 mmol/L — ABNORMAL HIGH (ref 98–111)
Creatinine, Ser: 0.51 mg/dL (ref 0.44–1.00)
GFR, Estimated: >60 mL/min (ref >60)
Glucose, Bld: 89 mg/dL (ref 70–99)
Potassium: 3.9 mmol/L (ref 3.5–5.1)
Sodium: 138 mmol/L (ref 135–145)
Total Bilirubin: 0.5 mg/dL (ref 0.3–1.2)
Total Protein: 7 g/dL (ref 6.5–8.1)

## 2021-12-31 LAB — URINALYSIS, ROUTINE W REFLEX MICROSCOPIC
Bacteria, UA: NONE SEEN
Bilirubin Urine: NEGATIVE
Glucose, UA: NEGATIVE mg/dL
Ketones, ur: NEGATIVE mg/dL
Leukocytes,Ua: NEGATIVE
Nitrite: NEGATIVE
Protein, ur: NEGATIVE mg/dL
Specific Gravity, Urine: >1.046 — ABNORMAL HIGH (ref 1.005–1.030)
pH: 6 (ref 5.0–8.0)

## 2021-12-31 MED ORDER — SODIUM CHLORIDE 0.9 % IV BOLUS (SEPSIS)
1000.0000 mL | Freq: Once | INTRAVENOUS | Status: AC
  Administered 2021-12-31: 1000 mL via INTRAVENOUS

## 2021-12-31 MED ORDER — METOCLOPRAMIDE HCL 5 MG/ML IJ SOLN
10.0000 mg | Freq: Once | INTRAMUSCULAR | Status: AC
  Administered 2021-12-31: 10 mg via INTRAVENOUS
  Filled 2021-12-31: qty 2

## 2021-12-31 MED ORDER — IOHEXOL 350 MG/ML SOLN
75.0000 mL | Freq: Once | INTRAVENOUS | Status: AC | PRN
  Administered 2021-12-31: 75 mL via INTRAVENOUS

## 2021-12-31 MED ORDER — KETOROLAC TROMETHAMINE 30 MG/ML IJ SOLN
30.0000 mg | Freq: Once | INTRAMUSCULAR | Status: AC
  Administered 2021-12-31: 30 mg via INTRAVENOUS
  Filled 2021-12-31: qty 1

## 2021-12-31 NOTE — ED Provider Notes (Signed)
[facility] Provider Note    Event Date/Time   First MD Initiated Contact with Patient 12/31/21 [phone]     (approximate)   History   Headache   HPI  Crystal Melendez is a 21 y.o. female with history of bipolar disorder, anxiety who presents to the emergency department with complaints of left-sided headache and right-sided blurry vision for the past few days.  Denies any history of headaches.  No head injury.  No fevers, neck pain or neck stiffness, numbness, tingling or weakness.  Has tried over-the-counter Tylenol, ibuprofen at home without relief.  She denies loss of vision, flashers, floaters, double vision.  Patient is a G3 P3 who just underwent spontaneous vaginal delivery at 56 and 6 on 12/26/2021.  She had no history of gestational hypertension, preeclampsia.  Did not require magnesium.  Patient is breast-feeding.   History provided by patient and family.    Past Medical History:  Diagnosis Date   ADHD    states when little; on no medication   Bipolar 1 disorder (HCC)    states dx'ed years ago; no medication   Generalized anxiety disorder 10/06/2021    Past Surgical History:  Procedure Laterality Date   DENTAL SURGERY     all four age 34    MEDICATIONS:  Prior to Admission medications   Medication Sig Start Date End Date Taking? Authorizing Provider  hydrOXYzine (ATARAX) 25 MG tablet Take 25 mg by mouth 3 (three) times daily as needed for anxiety.    [provider]  Prenatal Vit-Fe Fumarate-FA (MULTIVITAMIN-PRENATAL) 27-0.8 MG TABS tablet Take 1 tablet by mouth daily at 12 noon.    [provider]  sertraline (ZOLOFT) 50 MG tablet  11/21/21   [provider]    Physical Exam   Triage Vital Signs: ED Triage Vitals  Enc Vitals Group     BP 12/31/21 0517 120/80     Pulse Rate 12/31/21 0517 (!) 104     Resp 12/31/21 0517 18     Temp 12/31/21 0517 98.3 F (36.8 C)     Temp Source 12/31/21 0517 Oral     SpO2  12/31/21 0517 98 %     Weight 12/31/21 0518 155 lb 9.6 oz (70.6 kg)     Height 12/31/21 0518 5\' 1"  (1.549 m)     Head Circumference --      Peak Flow --      Pain Score 12/31/21 0518 4     Pain Loc --      Pain Edu? --      Excl. in GC? --     Most recent vital signs: Vitals:   12/31/21 0517 12/31/21 0636  BP: 120/80 115/82  Pulse: (!) 104 (!) 102  Resp: 18 18  Temp: 98.3 F (36.8 C)   SpO2: 98% 99%    CONSTITUTIONAL: Alert and oriented and responds appropriately to questions. Well-appearing; well-nourished HEAD: Normocephalic, atraumatic EYES: Conjunctivae clear, pupils appear equal, sclera nonicteric, extraocular movements intact ENT: normal nose; moist mucous membranes NECK: Supple, normal ROM CARD: RRR; S1 and S2 appreciated; no murmurs, no clicks, no rubs, no gallops RESP: Normal chest excursion without splinting or tachypnea; breath sounds clear and equal bilaterally; no wheezes, no rhonchi, no rales, no hypoxia or respiratory distress, speaking full sentences ABD/GI: Normal bowel sounds; non-distended; soft, non-tender, no rebound, no guarding, no peritoneal signs BACK: The back appears normal EXT: Normal ROM in all joints; no deformity noted, no edema; no  cyanosis SKIN: Normal color for age and race; warm; no rash on exposed skin NEURO: Moves all extremities equally, normal speech, normal sensation, normal gait PSYCH: The patient's mood and manner are appropriate.   ED Results / Procedures / Treatments   LABS: (all labs ordered are listed, but only abnormal results are displayed) Labs Reviewed  CBC WITH DIFFERENTIAL/PLATELET - Abnormal; Notable for the following components:      Result Value   WBC 12.2 (*)    Neutro Abs 7.9 (*)    All other components within normal limits  COMPREHENSIVE METABOLIC PANEL - Abnormal; Notable for the following components:   Chloride 112 (*)    CO2 20 (*)    Albumin 3.3 (*)    All other components within normal limits   URINALYSIS, ROUTINE W REFLEX MICROSCOPIC     EKG:  RADIOLOGY: My personal review and interpretation of imaging: CT venogram unremarkable.  I have personally reviewed all radiology reports.   CT VENOGRAM HEAD  Result Date: 12/31/2021 CLINICAL DATA:  Dural venous sinus thrombosis suspected EXAM: CT VENOGRAM HEAD TECHNIQUE: Venographic phase images of the brain were obtained following the administration of intravenous contrast. Multiplanar reformats and maximum intensity projections were generated. RADIATION DOSE REDUCTION: This exam was performed according to the departmental dose-optimization program which includes automated exposure control, adjustment of the mA and/or kV according to patient size and/or use of iterative reconstruction technique. CONTRAST:  66mL OMNIPAQUE IOHEXOL 350 MG/ML SOLN COMPARISON:  Head CT 03/30/2006 FINDINGS: No infarct, hemorrhage, hydrocephalus, or mass. The dural sinuses are diffusely patent and normally enhancing without stricture. No sign of cortical or deep vein thrombosis. Major arterial structures are grossly enhancing. Negative sinuses and orbits. IMPRESSION: Normal CT venogram. Electronically Signed   By: Tiburcio Pea M.D.   On: 12/31/2021 06:27     PROCEDURES:  Critical Care performed: No    Procedures    IMPRESSION / MDM / ASSESSMENT AND PLAN / ED COURSE  I reviewed the triage vital signs and the nursing notes.    Patient here with complaints of headache and blurry vision.  The patient is on the cardiac monitor to evaluate for evidence of arrhythmia and/or significant heart rate changes.   DIFFERENTIAL DIAGNOSIS (includes but not limited to):   Migraine, dural sinus thrombosis, less likely intracranial hemorrhage, aneurysm, meningitis.  Less likely preeclampsia given no history and blood pressures currently normal.   Patient's presentation is most consistent with acute presentation with potential threat to life or bodily  function.   PLAN: Will obtain CBC, CMP, CT venogram, urine.  Will give migraine cocktail that is safe for breast-feeding.   MEDICATIONS GIVEN IN ED: Medications  iohexol (OMNIPAQUE) 350 MG/ML injection 75 mL (75 mLs Intravenous Contrast Given 12/31/21 0618)  sodium chloride 0.9 % bolus 1,000 mL (1,000 mLs Intravenous New Bag/Given 12/31/21 0641)  ketorolac (TORADOL) 30 MG/ML injection 30 mg (30 mg Intravenous Given 12/31/21 0641)  metoCLOPramide (REGLAN) injection 10 mg (10 mg Intravenous Given 12/31/21 0641)     ED COURSE: Labs show slight leukocytosis which may be reactive.  No fevers.  Normal electrolytes other than bicarb of 20, chloride of 112.  Normal creatinine.  Blood pressures continue to be stable.  CT venogram reviewed and interpreted by myself and the radiologist and shows no acute abnormality.  Patient will need reassessment after IV medications.  Urine also pending.  Signed out to oncoming ED provider.   CONSULTS:  Dispo pending   OUTSIDE RECORDS REVIEWED: Reviewed patient's  delivery note on 12/26/2021.       FINAL CLINICAL IMPRESSION(S) / ED DIAGNOSES   Final diagnoses:  Left-sided headache  Blurred vision, right eye     Rx / DC Orders   ED Discharge Orders     None        Note:  This document was prepared using Dragon voice recognition software and may include unintentional dictation errors.   Rachid Parham, Layla Maw, DO 12/31/21 [phone]

## 2021-12-31 NOTE — ED Triage Notes (Addendum)
Intermittent h/a since yesterday with blurred vision in R eye. Pt reports no relief with motrin or tylenol. Pt alert and oriented following commands. Denies parasthesia. Mild nausea no emesis. Pt breathing unlabored speaking in full sentences.   Patient gave birth 4 days ago.

## 2021-12-31 NOTE — ED Provider Notes (Signed)
Patient received in signout from Dr. Elesa Massed pending follow-up urinalysis and reassessment after analgesia.  Patient feels symptoms significantly improved.  Exam remains nonfocal and she is well-appearing.  Urine is concentrated but no proteinuria.  No sign of infection.  offered IV fluids patient feels comfortable with discharge home increasing hydration with oral fluids as an outpatient which is reasonable given her presentation.  Discussed return precautions family agreeable to plan.   Willy Eddy, MD 12/31/21 [phone]

## 2021-12-31 NOTE — ED Notes (Signed)
Pt brought to ED rm 8 at this time, this RN now assuming care. 

## 2021-12-31 NOTE — Discharge Instructions (Signed)
You may alternate Tylenol 1000 mg every 6 hours as needed for pain, fever and Ibuprofen 800 mg every 6-8 hours as needed for pain, fever.  Please take Ibuprofen with food.  Do not take more than 4000 mg of Tylenol (acetaminophen) in a 24 hour period. ° °

## 2022-01-02 ENCOUNTER — Encounter: Payer: Medicaid Other | Admitting: Advanced Practice Midwife

## 2022-01-12 ENCOUNTER — Ambulatory Visit (INDEPENDENT_AMBULATORY_CARE_PROVIDER_SITE_OTHER): Payer: Medicaid Other | Admitting: Licensed Practical Nurse

## 2022-01-12 DIAGNOSIS — Z1332 Encounter for screening for maternal depression: Secondary | ICD-10-CM

## 2022-01-12 NOTE — Progress Notes (Unsigned)
Virtual Visit via Telephone Note  I connected with Crystal Melendez on 01/12/22 at  4:15 PM EST by telephone and verified that I am speaking with the correct person using two identifiers.  Location: Patient: At her home in [place], [place]  Provider: office in [place] in [place]    I discussed the limitations, risks, security and privacy concerns of performing an evaluation and management service by telephone and the availability of in person appointments. I also discussed with the patient that there may be a patient responsible charge related to this service. The patient expressed understanding and agreed to proceed.   History of Present Illness: SVB 11/28 with LMD, intact perineum  Partner coming home for  Christmas, will leave at the New year but then be home for good after February, excited for his return Her mother and father are around for support Sleep: sleeps when the baby sleeps Bleeding: barely bleeding now No concerns for voiding, stooling, or perineum Appetite is good Breastfeeding is going well, infant gaining weight  Mood anxious about cold and  flu season , infant got RSV vaccine.  These fears are not disrupting her day to day activities. Taking the standard precautions to avoid illness.  Wants one more child in 2 years, plans IUD   Observations/Objective: Speaking clearly in coherent sentences   Assessment and Plan:  Stable 2 Postpartum at 2 weeks   Follow Up Instructions: RTC in 4 weeks for PP exam and IUD insertion.   No concern for PP mood disorder, reassured Crystal Melendez it is normal and reasonable to be concerned about illness during the cold/flu season.    I discussed the assessment and treatment plan with the patient. The patient was provided an opportunity to ask questions and all were answered. The patient agreed with the plan and demonstrated an understanding of the instructions.   The patient was advised to call back or seek an in-person evaluation if the symptoms  worsen or if the condition fails to improve as anticipated.  I provided 15 minutes of non-face-to-face time during this encounter.   Ellouise Newer Bentley Fissel, CNM

## 2022-02-07 ENCOUNTER — Other Ambulatory Visit (HOSPITAL_COMMUNITY)
Admission: RE | Admit: 2022-02-07 | Discharge: 2022-02-07 | Disposition: A | Discharge status: Home / Self Care | Payer: Medicaid Other | Source: Ambulatory Visit | Attending: Licensed Practical Nurse | Admitting: Licensed Practical Nurse

## 2022-02-07 ENCOUNTER — Ambulatory Visit (INDEPENDENT_AMBULATORY_CARE_PROVIDER_SITE_OTHER): Payer: Medicaid Other | Admitting: Licensed Practical Nurse

## 2022-02-07 DIAGNOSIS — Z124 Encounter for screening for malignant neoplasm of cervix: Secondary | ICD-10-CM

## 2022-02-07 DIAGNOSIS — Z1332 Encounter for screening for maternal depression: Secondary | ICD-10-CM

## 2022-02-07 NOTE — Progress Notes (Signed)
Postpartum Visit  Chief Complaint:  Chief Complaint  Patient presents with   Postpartum Care    6 weeks     History of Present Illness: Patient is a 22 y.o. Crystal Melendez presents for postpartum visit.  Date of delivery: 11/28 Type of delivery: Vaginal delivery - Vacuum or forceps assisted  no Episiotomy No.  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 Continues to have anxiety related the health of her children, afraid they will get sick. Has a mental health provider that prescribes her medication-has an apt in a few days, currently trying to find a therapist. May consider increasing Zoloft dose. Discussed returning to the gym as she know regular exercise helps her mood.   Newborn Details:  SINGLETON :  1. Baby's name: Crystal Melendez. Birth weight: 3060grams Maternal Details:  Breast Feeding:  yes Post partum depression/anxiety noted:  yes Edinburgh Post-Partum Depression Score:  9  Date of last PAP: first ever collected today   Past Medical History:  Diagnosis Date   ADHD    states when little; on no medication   Bipolar 1 disorder (HCC)    states dx'ed years ago; no medication   Generalized anxiety disorder 10/06/2021    Past Surgical History:  Procedure Laterality Date   DENTAL SURGERY     all four age 43    Prior to Admission medications   Medication Sig Start Date End Date Taking? Authorizing Provider  hydrOXYzine (ATARAX) 25 MG tablet Take 25 mg by mouth 3 (three) times daily as needed for anxiety.    Yes [provider]  Prenatal Vit-Fe Fumarate-FA (MULTIVITAMIN-PRENATAL) 27-0.8 MG TABS tablet Take 1 tablet by mouth daily at 12 noon.   Yes [provider]  sertraline (ZOLOFT) 50 MG tablet  11/21/21  Yes [provider]    Allergies  Allergen Reactions   Sulfa Antibiotics Rash     Social History   Socioeconomic History   Marital status: Married    Spouse name: Not on file   Number of children: 2   Years of education: 12   Highest education level: Not on file  Occupational History   Occupation: Consulting civil engineer  Tobacco Use   Smoking status: Never   Smokeless tobacco: Never  Vaping Use   Vaping Use: Former   Devices: last use one month ago  Substance and Sexual Activity   Alcohol use: Not Currently    Comment: last use 4 years ago   Drug use: Not Currently    Frequency: 3.0 times per week    Types: Marijuana    Comment: last use 4 years ago   Sexual activity: Yes    Comment: undecided  Other Topics Concern   Not on file  Social History Narrative   Not on file   Social Determinants of Health   Financial Resource Strain: Low Risk  (06/23/2021)   Overall Financial Resource Strain (CARDIA)    Difficulty of Paying Living Expenses: Not hard at all  Food Insecurity:  No Food Insecurity (12/26/2021)   Hunger Vital Sign    Worried About Running Out of Food in the Last Year: Never true    Ran Out of Food in the Last Year: Never true  Transportation Needs: No Transportation Needs (12/26/2021)   PRAPARE - Hydrologist (Medical): No    Lack of Transportation (Non-Medical): No  Physical Activity: Not on file  Stress: No Stress Concern Present (06/23/2021)   [place]    Feeling of Stress : Not at all  Social Connections: Moderately Integrated (06/23/2021)   Social Connection and Isolation Panel [NHANES]    Frequency of Communication with Friends and Family: Once  a week    Frequency of Social Gatherings with Friends and Family: More than three times a week    Attends Religious Services: 1 to 4 times per year    Active Member of [employer] or Organizations: No    Attends Archivist Meetings: Never    Marital Status: Married  Human resources officer Violence: Not At Risk (12/26/2021)   Humiliation, Afraid, Rape, and Kick questionnaire    Fear of Current or Ex-Partner: No    Emotionally Abused: No    Physically Abused: No    Sexually Abused: No    Family History  Problem Relation Age of Onset   Diabetes Maternal Grandmother    Bipolar disorder Maternal Grandmother     Review of Systems  Constitutional: Negative.   Eyes: Negative.   Respiratory: Negative.    Cardiovascular: Negative.   Gastrointestinal: Negative.   Genitourinary: Negative.   Neurological: Negative.   Psychiatric/Behavioral:  Positive for depression. The patient is nervous/anxious.      Physical Exam BP 109/69   Pulse 87   Wt 145 lb 12.8 oz (66.1 kg)   Breastfeeding Yes   BMI 27.55 kg/m   Physical Exam Constitutional:      Appearance: Normal appearance.  Genitourinary:     Vulva normal.     Genitourinary Comments: Cervix pink, no lesions Uterus non gravid, non tender no masses adnexa non tender no masses, some tone present   Cardiovascular:     Rate and Rhythm: Normal rate and regular rhythm.     Pulses: Normal pulses.     Heart sounds: Normal heart sounds.  Pulmonary:     Effort: Pulmonary effort is normal.     Breath sounds: Normal breath sounds.  Chest:     Comments: Breasts: lactating, no redness or masses. Nipples intact bilaterally  Abdominal:     General: Abdomen is flat.     Tenderness: There is no abdominal tenderness.  Musculoskeletal:        General: Normal range of motion.     Cervical back: Normal range of motion and neck supple.  Neurological:     Mental Status: She is alert.  Skin:    General: Skin is warm.  Psychiatric:        Mood and  Affect: Mood normal.       Assessment: 22 y.o. Crystal Melendez presenting for 6 week postpartum visit  Plan: Problem List Items Addressed This Visit   None Visit Diagnoses     Care and examination of lactating mother    -  Primary   Relevant Orders   Cytology - PAP   Cervical cancer screening       Relevant Orders   Cytology - PAP        1) Contraception  Education given regarding options for contraception, undecided at this time, her partner will not be home for a while therefore does not feel the need for contraception right now.  Will schedule an apt if desired.   2)  Pap - ASCCP guidelines and rational discussed.  Patient opts for 3year screening interval  3) Patient underwent screening for postpartum depression with some concerns noted. Will follow up with mental health provider   4) Follow up 1 year for routine annual exam  Heywood Tokunaga, [facility]  02/07/22  12:27 PM

## 2022-02-12 LAB — CYTOLOGY - PAP: Diagnosis: NEGATIVE

## 2022-02-27 ENCOUNTER — Ambulatory Visit: Payer: Medicaid Other | Admitting: Licensed Practical Nurse

## 2022-03-05 ENCOUNTER — Telehealth: Payer: Self-pay

## 2022-03-05 NOTE — Telephone Encounter (Signed)
Patient has concerns that she has not started her cycle, since she stopped breastfeeding 2 weeks ago. I informed patient that if you are a breastfeeding parent, your menstrual cycle will return within one to two months after you stop nursing your child. She verbalized understanding.

## 2022-03-12 ENCOUNTER — Emergency Department: Payer: Medicaid Other

## 2022-03-12 ENCOUNTER — Emergency Department
Admission: EM | Admit: 2022-03-12 | Discharge: 2022-03-12 | Disposition: A | Discharge status: Home / Self Care | Payer: Medicaid Other | Attending: Emergency Medicine | Admitting: Emergency Medicine

## 2022-03-12 ENCOUNTER — Other Ambulatory Visit: Payer: Self-pay

## 2022-03-12 DIAGNOSIS — R197 Diarrhea, unspecified: Secondary | ICD-10-CM | POA: Diagnosis not present

## 2022-03-12 DIAGNOSIS — Z1152 Encounter for screening for COVID-19: Secondary | ICD-10-CM | POA: Insufficient documentation

## 2022-03-12 DIAGNOSIS — R1031 Right lower quadrant pain: Secondary | ICD-10-CM | POA: Diagnosis not present

## 2022-03-12 DIAGNOSIS — R110 Nausea: Secondary | ICD-10-CM | POA: Diagnosis not present

## 2022-03-12 DIAGNOSIS — M545 Low back pain, unspecified: Secondary | ICD-10-CM | POA: Insufficient documentation

## 2022-03-12 LAB — RESP PANEL BY RT-PCR (RSV, FLU A&B, COVID)  RVPGX2
Influenza A by PCR: NEGATIVE
Influenza B by PCR: NEGATIVE
Resp Syncytial Virus by PCR: NEGATIVE
SARS Coronavirus 2 by RT PCR: NEGATIVE

## 2022-03-12 LAB — URINALYSIS, ROUTINE W REFLEX MICROSCOPIC
Bilirubin Urine: NEGATIVE
Glucose, UA: NEGATIVE mg/dL
Hgb urine dipstick: NEGATIVE
Ketones, ur: NEGATIVE mg/dL
Leukocytes,Ua: NEGATIVE
Nitrite: NEGATIVE
Protein, ur: NEGATIVE mg/dL
Specific Gravity, Urine: 1.003 — ABNORMAL LOW (ref 1.005–1.030)
pH: 6 (ref 5.0–8.0)

## 2022-03-12 LAB — COMPREHENSIVE METABOLIC PANEL
ALT: 51 U/L — ABNORMAL HIGH (ref 0–44)
AST: 31 U/L (ref 15–41)
Albumin: 4.1 g/dL (ref 3.5–5.0)
Alkaline Phosphatase: 69 U/L (ref 38–126)
Anion gap: 9 (ref 5–15)
BUN: 10 mg/dL (ref 6–20)
CO2: 24 mmol/L (ref 22–32)
Calcium: 9.1 mg/dL (ref 8.9–10.3)
Chloride: 105 mmol/L (ref 98–111)
Creatinine, Ser: 0.56 mg/dL (ref 0.44–1.00)
GFR, Estimated: >60 mL/min (ref >60)
Glucose, Bld: 98 mg/dL (ref 70–99)
Potassium: 3.8 mmol/L (ref 3.5–5.1)
Sodium: 138 mmol/L (ref 135–145)
Total Bilirubin: 0.6 mg/dL (ref 0.3–1.2)
Total Protein: 7.5 g/dL (ref 6.5–8.1)

## 2022-03-12 LAB — CBC WITH DIFFERENTIAL/PLATELET
Abs Immature Granulocytes: 0.03 10*3/uL (ref 0.00–0.07)
Basophils Absolute: 0 10*3/uL (ref 0.0–0.1)
Basophils Relative: 1 %
Eosinophils Absolute: 0.2 10*3/uL (ref 0.0–0.5)
Eosinophils Relative: 2 %
HCT: 39.2 % (ref 36.0–46.0)
Hemoglobin: 13.5 g/dL (ref 12.0–15.0)
Immature Granulocytes: 0 %
Lymphocytes Relative: 33 %
Lymphs Abs: 2.7 10*3/uL (ref 0.7–4.0)
MCH: 30.7 pg (ref 26.0–34.0)
MCHC: 34.4 g/dL (ref 30.0–36.0)
MCV: 89.1 fL (ref 80.0–100.0)
Monocytes Absolute: 0.5 10*3/uL (ref 0.1–1.0)
Monocytes Relative: 6 %
Neutro Abs: 4.9 10*3/uL (ref 1.7–7.7)
Neutrophils Relative %: 58 %
Platelets: 288 10*3/uL (ref 150–400)
RBC: 4.4 MIL/uL (ref 3.87–5.11)
RDW: 11.6 % (ref 11.5–15.5)
WBC: 8.4 10*3/uL (ref 4.0–10.5)
nRBC: 0 % (ref 0.0–0.2)

## 2022-03-12 LAB — POC URINE PREG, ED: Preg Test, Ur: NEGATIVE

## 2022-03-12 LAB — LIPASE, BLOOD: Lipase: 32 U/L (ref 11–51)

## 2022-03-12 MED ORDER — IOHEXOL 300 MG/ML  SOLN
100.0000 mL | Freq: Once | INTRAMUSCULAR | Status: AC | PRN
  Administered 2022-03-12: 100 mL via INTRAVENOUS

## 2022-03-12 MED ORDER — KETOROLAC TROMETHAMINE 30 MG/ML IJ SOLN
30.0000 mg | Freq: Once | INTRAMUSCULAR | Status: AC
  Administered 2022-03-12: 30 mg via INTRAVENOUS
  Filled 2022-03-12: qty 1

## 2022-03-12 NOTE — ED Triage Notes (Addendum)
Pt c/o lower abdominal pain and lower right back pain x3.5 hours. Pt denies vomiting, c/o slight nausea and diarrhea this morning. Pt denies unusual food intake, denies bleeding. Pt AOX4, nad noted. Pt gave birth in November 2023 and is not currently breastfeeding.

## 2022-03-12 NOTE — Discharge Instructions (Addendum)
Your exam, labs, and CT are normal and reassuring this time.  No indication of appendicitis or any other abdominal process to explain your symptoms.  You do have evidence of a dominant follicle on the right side which could explain your symptoms.  Follow-up with primary provider or OB provider for ongoing symptoms or return to the ED needed.

## 2022-03-12 NOTE — ED Provider Notes (Signed)
[facility] Emergency Department Provider Note     Event Date/Time   First MD Initiated Contact with Patient 03/12/22 1831     (approximate)   History   Back Pain   HPI  Crystal Melendez is a 22 y.o. female with a history of ADHD, bipolar disorder, generalized anxiety anxiety, presents to the ED with 3.5 hours of intermittent right lower abdominal pain with low back pain pain on the right side.  Patient describes the discomfort as burning in nature.  She denies any abnormal vaginal bleeding, but did endorse an episode of bright red blood on toilet tissue but she denies any rectal pain at this time.  Patient denies any associated vomiting but reports some mild nausea and diarrhea earlier this morning.  She denies any chest pain, shortness breath, fevers, chills, or sweats.  Patient is 3 months postpartum and is not currently breast-feeding.    Physical Exam   Triage Vital Signs: ED Triage Vitals [03/12/22 1750]  Enc Vitals Group     BP 127/82     Pulse Rate 89     Resp 16     Temp 98.6 F (37 C)     Temp Source Oral     SpO2 98 %     Weight      Height      Head Circumference      Peak Flow      Pain Score 7     Pain Loc      Pain Edu?      Excl. in [place]?     Most recent vital signs: Vitals:   03/12/22 1750  BP: 127/82  Pulse: 89  Resp: 16  Temp: 98.6 F (37 C)  SpO2: 98%    General Awake, no distress. NAD CV:  Good peripheral perfusion.  RESP:  Normal effort.  ABD:  No distention.  No rebound, guarding, or rigidity.  Patient with tenderness to palpation to the right lower quadrant and the right flank.  Normoactive bowel sounds appreciated. GU:  deferred  ED Results / Procedures / Treatments   Labs (all labs ordered are listed, but only abnormal results are displayed) Labs Reviewed  URINALYSIS, ROUTINE W REFLEX MICROSCOPIC - Abnormal; Notable for the following components:      Result Value   Color, Urine COLORLESS (*)     APPearance CLEAR (*)    Specific Gravity, Urine 1.003 (*)    All other components within normal limits  COMPREHENSIVE METABOLIC PANEL - Abnormal; Notable for the following components:   ALT 51 (*)    All other components within normal limits  RESP PANEL BY RT-PCR (RSV, FLU A&B, COVID)  RVPGX2  CBC WITH DIFFERENTIAL/PLATELET  LIPASE, BLOOD  POC URINE PREG, ED    EKG   RADIOLOGY  I personally viewed and evaluated these images as part of my medical decision making, as well as reviewing the written report by the radiologist.  ED Provider Interpretation: no acute findings  CT ABDOMEN PELVIS W CONTRAST  Result Date: 03/12/2022 CLINICAL DATA:  Right lower quadrant abdominal pain EXAM: CT ABDOMEN AND PELVIS WITH CONTRAST TECHNIQUE: Multidetector CT imaging of the abdomen and pelvis was performed using the standard protocol following bolus administration of intravenous contrast. RADIATION DOSE REDUCTION: This exam was performed according to the departmental dose-optimization program which includes automated exposure control, adjustment of the mA and/or kV according to patient size and/or use of iterative reconstruction technique. CONTRAST:  147m OMNIPAQUE  IOHEXOL 300 MG/ML  SOLN COMPARISON:  None Available. FINDINGS: Lower chest: No acute abnormality. Hepatobiliary: No focal liver abnormality is seen. No gallstones, gallbladder wall thickening, or biliary dilatation. Pancreas: Unremarkable Spleen: Unremarkable Adrenals/Urinary Tract: Adrenal glands are unremarkable. Kidneys are normal, without renal calculi, focal lesion, or hydronephrosis. Bladder is unremarkable. Stomach/Bowel: Stomach is within normal limits. Appendix appears normal. No evidence of bowel wall thickening, distention, or inflammatory changes. Vascular/Lymphatic: Bilateral adnexal varices and prominent myometrial vascularity may reflect changes of recent pregnancy. The abdominal vasculature is otherwise unremarkable. No pathologic  adenopathy within the abdomen and pelvis. Reproductive: Dominant follicle within the right ovary. The pelvic organs are otherwise unremarkable. Other: No abdominal wall hernia or abnormality. No abdominopelvic ascites. Musculoskeletal: No acute or significant osseous findings. IMPRESSION: 1. No acute intra-abdominal pathology identified. No definite radiographic explanation for the patient's reported symptoms. Electronically Signed   By: Fidela Salisbury M.D.   On: 03/12/2022 20:05     PROCEDURES:  Critical Care performed: No  Procedures   MEDICATIONS ORDERED IN ED: Medications  ketorolac (TORADOL) 30 MG/ML injection 30 mg (has no administration in time range)  iohexol (OMNIPAQUE) 300 MG/ML solution 100 mL (100 mLs Intravenous Contrast Given 03/12/22 1939)     IMPRESSION / MDM / ASSESSMENT AND PLAN / ED COURSE  I reviewed the triage vital signs and the nursing notes.                              Differential diagnosis includes, but is not limited to, ovarian cyst, ovarian torsion, acute appendicitis, diverticulitis, urinary tract infection/pyelonephritis, endometriosis, bowel obstruction, colitis, renal colic, gastroenteritis, hernia, fibroids, endometriosis, pregnancy related pain including ectopic pregnancy, etc.   Patient's presentation is most consistent with acute complicated illness / injury requiring diagnostic workup.  Patient's diagnosis is consistent with abdominal pain without underlying cause.  Patient with reassuring exam, workup overall.  No sign of acute viral illness, UTI, pregnancy.  No lab abnormalities to include leukocytosis, critical anemia, or electrolyte abnormality.  Patient CT scan is normal and reassuring as it shows no signs of acute intra-abdominal process, based on my review of images.  Patient may be experiencing some intermittent lower abdominal pain secondary to abdominal ovarian follicle in the right.  Patient otherwise stable at this time for discharge and  outpatient follow-up.  No indication of toxic appearance, dehydration, no acute aminal process.  Patient will be discharged home with directions to take OTC medication for symptom relief.. Patient is to follow up with her PCP or OB provider as needed or otherwise directed. Patient is given ED precautions to return to the ED for any worsening or new symptoms.   FINAL CLINICAL IMPRESSION(S) / ED DIAGNOSES   Final diagnoses:  Right lower quadrant abdominal pain     Rx / DC Orders   ED Discharge Orders     None        Note:  This document was prepared using Dragon voice recognition software and may include unintentional dictation errors.    Melvenia Needles, PA-C 03/12/22 2100    Rada Hay, MD 03/12/22 2230

## 2022-03-12 NOTE — ED Notes (Signed)
See triage note  Presents with right lower back pain  and states is also having some lower abd  States she noticed pain about 5 days ago  No fever   positive nausea

## 2022-03-29 ENCOUNTER — Ambulatory Visit: Payer: Medicaid Other | Admitting: Licensed Practical Nurse

## 2022-03-29 VITALS — BP 114/82 | HR 103 | Wt 141.1 lb

## 2022-03-29 DIAGNOSIS — Z30430 Encounter for insertion of intrauterine contraceptive device: Secondary | ICD-10-CM | POA: Diagnosis not present

## 2022-03-29 MED ORDER — LEVONORGESTREL 20 MCG/DAY IU IUD
1.0000 | INTRAUTERINE_SYSTEM | Freq: Once | INTRAUTERINE | Status: AC
  Administered 2022-03-29: 1 via INTRAUTERINE

## 2022-03-29 NOTE — Progress Notes (Signed)
   GYNECOLOGY OFFICE PROCEDURE NOTE  Crystal Melendez is a 22 y.o. [phone] here for Mirena IUD insertion. No GYN concerns.  Last pap smear was on 02/07/22 and was normal.  The patient is currently using PP for contraception and her LMP is SVB 12/26/21. Patient has not had sex since the birth of her child.  The indication for her IUD is contraception/cycle control. -Pt currently separated from her husband. She has been speaking with someone and considering a  sexual relationship.   IUD Insertion Procedure Note Patient identified, informed consent performed, consent signed.   Discussed risks of irregular bleeding, cramping, infection, malpositioning, expulsion or uterine perforation of the IUD (1:1000 placements)  which may require further procedure such as laparoscopy.  IUD while effective at preventing pregnancy do not prevent transmission of sexually transmitted diseases and use of barrier methods for this purpose was discussed. Time out was performed.  Urine pregnancy test negative.  Speculum placed in the vagina.  Cervix visualized.  Cleaned with Betadine x 2.  Grasped anteriorly with a single tooth tenaculum.  Uterus sounded to 7 cm. IUD placed per manufacturer's recommendations.  Strings trimmed to 3 cm. Tenaculum was removed, good hemostasis noted.  Patient tolerated procedure well.   Patient was given post-procedure instructions.  She was advised to have backup contraception for one week.  Patient was also asked to check IUD strings periodically and follow up in 6 weeks for IUD check.   Pt seen by CNM and [facility]

## 2022-04-03 ENCOUNTER — Encounter: Payer: Self-pay | Admitting: Licensed Practical Nurse

## 2022-04-03 ENCOUNTER — Other Ambulatory Visit: Payer: Self-pay

## 2022-04-03 ENCOUNTER — Emergency Department: Payer: Medicaid Other

## 2022-04-03 ENCOUNTER — Emergency Department
Admission: EM | Admit: 2022-04-03 | Discharge: 2022-04-04 | Disposition: A | Discharge status: Home / Self Care | Payer: Medicaid Other | Attending: Emergency Medicine | Admitting: Emergency Medicine

## 2022-04-03 DIAGNOSIS — R051 Acute cough: Secondary | ICD-10-CM | POA: Insufficient documentation

## 2022-04-03 DIAGNOSIS — Z1152 Encounter for screening for COVID-19: Secondary | ICD-10-CM | POA: Insufficient documentation

## 2022-04-03 DIAGNOSIS — R0781 Pleurodynia: Secondary | ICD-10-CM | POA: Diagnosis not present

## 2022-04-03 DIAGNOSIS — F419 Anxiety disorder, unspecified: Secondary | ICD-10-CM | POA: Insufficient documentation

## 2022-04-03 DIAGNOSIS — J069 Acute upper respiratory infection, unspecified: Secondary | ICD-10-CM

## 2022-04-03 DIAGNOSIS — R059 Cough, unspecified: Secondary | ICD-10-CM | POA: Diagnosis present

## 2022-04-03 LAB — CBC WITH DIFFERENTIAL/PLATELET
Abs Immature Granulocytes: 0.02 10*3/uL (ref 0.00–0.07)
Basophils Absolute: 0.1 10*3/uL (ref 0.0–0.1)
Basophils Relative: 1 %
Eosinophils Absolute: 0.1 10*3/uL (ref 0.0–0.5)
Eosinophils Relative: 2 %
HCT: 39.6 % (ref 36.0–46.0)
Hemoglobin: 13.3 g/dL (ref 12.0–15.0)
Immature Granulocytes: 0 %
Lymphocytes Relative: 48 %
Lymphs Abs: 3.5 10*3/uL (ref 0.7–4.0)
MCH: 30.1 pg (ref 26.0–34.0)
MCHC: 33.6 g/dL (ref 30.0–36.0)
MCV: 89.6 fL (ref 80.0–100.0)
Monocytes Absolute: 0.5 10*3/uL (ref 0.1–1.0)
Monocytes Relative: 7 %
Neutro Abs: 3 10*3/uL (ref 1.7–7.7)
Neutrophils Relative %: 42 %
Platelets: 350 10*3/uL (ref 150–400)
RBC: 4.42 MIL/uL (ref 3.87–5.11)
RDW: 11.8 % (ref 11.5–15.5)
WBC: 7.2 10*3/uL (ref 4.0–10.5)
nRBC: 0 % (ref 0.0–0.2)

## 2022-04-03 LAB — D-DIMER, QUANTITATIVE: D-Dimer, Quant: <0.27 ug/mL-FEU (ref 0.00–0.50)

## 2022-04-03 LAB — BASIC METABOLIC PANEL
Anion gap: 9 (ref 5–15)
BUN: 9 mg/dL (ref 6–20)
CO2: 22 mmol/L (ref 22–32)
Calcium: 9.1 mg/dL (ref 8.9–10.3)
Chloride: 110 mmol/L (ref 98–111)
Creatinine, Ser: 0.68 mg/dL (ref 0.44–1.00)
GFR, Estimated: >60 mL/min (ref >60)
Glucose, Bld: 120 mg/dL — ABNORMAL HIGH (ref 70–99)
Potassium: 3.3 mmol/L — ABNORMAL LOW (ref 3.5–5.1)
Sodium: 141 mmol/L (ref 135–145)

## 2022-04-03 LAB — RESP PANEL BY RT-PCR (RSV, FLU A&B, COVID)  RVPGX2
Influenza A by PCR: NEGATIVE
Influenza B by PCR: NEGATIVE
Resp Syncytial Virus by PCR: NEGATIVE
SARS Coronavirus 2 by RT PCR: NEGATIVE

## 2022-04-03 LAB — TROPONIN I (HIGH SENSITIVITY): Troponin I (High Sensitivity): <2 ng/L (ref <18)

## 2022-04-03 LAB — HCG, QUANTITATIVE, PREGNANCY: hCG, Beta Chain, Quant, S: <1 m[IU]/mL (ref <5)

## 2022-04-03 MED ORDER — GUAIFENESIN 100 MG/5ML PO LIQD
5.0000 mL | Freq: Once | ORAL | Status: AC
  Administered 2022-04-03: 5 mL via ORAL
  Filled 2022-04-03: qty 10

## 2022-04-03 MED ORDER — ACETAMINOPHEN 500 MG PO TABS
1000.0000 mg | ORAL_TABLET | Freq: Once | ORAL | Status: AC
  Administered 2022-04-03: 1000 mg via ORAL
  Filled 2022-04-03: qty 2

## 2022-04-03 MED ORDER — SODIUM CHLORIDE 0.9 % IV BOLUS
1000.0000 mL | Freq: Once | INTRAVENOUS | Status: AC
  Administered 2022-04-03: 1000 mL via INTRAVENOUS

## 2022-04-03 NOTE — ED Provider Notes (Signed)
[facility] Provider Note    Event Date/Time   First MD Initiated Contact with Patient 04/03/22 2230     (approximate)   History   Cough and ribcage pain    HPI  Crystal Melendez is a 22 y.o. female comes in for cough and chest pain.  Patient reports that she has had cough and congestion for 5 days has been on and on antibiotics but has not been much improved reports the coughing seems to be worse and now she is having some blood-tinged sputum with coughing.  She reports some chest pain as well.  Denies any significant shortness of breath but does state that she does have an IUD in place no other risk factors for PE.  She does report having some anxiety just being really worried that there something else going on.  She does report that she was straight slightly started on Macrobid for concerns for UTI.  Patient does report that she does vape but she has been trying to cut down.   Physical Exam   Triage Vital Signs: ED Triage Vitals [04/03/22 2217]  Enc Vitals Group     BP 118/69     Pulse Rate (!) 110     Resp 18     Temp 98.3 F (36.8 C)     Temp Source Oral     SpO2 98 %     Weight 142 lb (64.4 kg)     Height '5\' 1"'$  (1.549 m)     Head Circumference      Peak Flow      Pain Score 4     Pain Loc      Pain Edu?      Excl. in [place]?     Most recent vital signs: Vitals:   04/03/22 2217  BP: 118/69  Pulse: (!) 110  Resp: 18  Temp: 98.3 F (36.8 C)  SpO2: 98%     General: Awake, no distress.  CV:  Good peripheral perfusion.  Resp:  Normal effort.  Abd:  No distention.  Other:  No swelling in legs.  No calf tenderness.  Clear lungs.  Intermittently coughing.  No chest wall tenderness with palpation.   ED Results / Procedures / Treatments   Labs (all labs ordered are listed, but only abnormal results are displayed) Labs Reviewed  RESP PANEL BY RT-PCR (RSV, FLU A&B, COVID)  RVPGX2  CBC WITH DIFFERENTIAL/PLATELET  HCG, QUANTITATIVE,  PREGNANCY  BASIC METABOLIC PANEL  D-DIMER, QUANTITATIVE  TROPONIN I (HIGH SENSITIVITY)     EKG  My interpretation of EKG:  Sinus rate of 83 without any ST elevation or T wave inversions, normal intervals  RADIOLOGY I have reviewed the xray personally and interpreted no evidence of any pneumonia  PROCEDURES:  Critical Care performed: No  Procedures   MEDICATIONS ORDERED IN ED: Medications  acetaminophen (TYLENOL) tablet 1,000 mg (1,000 mg Oral Given 04/03/22 2255)  guaiFENesin (ROBITUSSIN) 100 MG/5ML liquid 5 mL (5 mLs Oral Given 04/03/22 2255)  sodium chloride 0.9 % bolus 1,000 mL (1,000 mLs Intravenous New Bag/Given 04/03/22 2255)     IMPRESSION / MDM / ASSESSMENT AND PLAN / ED COURSE  I reviewed the triage vital signs and the nursing notes.   Patient's presentation is most consistent with acute presentation with potential threat to life or bodily function.   Patient comes in with continued chest pain, coughing currently on Augmentin.  Discussed with patient I suspect this is most likely a  viral illness that is causing the cough and chest pain and some inflammation of her airways.  We discussed workup for PE given her tachycardia and her hemoptysis and she would like to proceed with D-dimer.  Will get troponin to evaluate for any myocarditis, repeat COVID, flu chest x-ray to evaluate for any pneumonia effusion or other acute pathology.  CBC reassuring.     FINAL CLINICAL IMPRESSION(S) / ED DIAGNOSES   Final diagnoses:  Acute cough     Rx / DC Orders   ED Discharge Orders     None        Note:  This document was prepared using Dragon voice recognition software and may include unintentional dictation errors.   Vanessa Seward, MD 04/03/22 [phone]

## 2022-04-03 NOTE — ED Triage Notes (Addendum)
Pt reports cough and congestion x 5 days. Reports seen at [facility] and dx with sinus infection and was given abx. Pt reports rib cage pain with coughing. Pt also reports blood tinged sputum at times with coughing. Pt ambulatory to triage with independent and steady gait. Breathing unlabored with symmetric chest rise and fall. Pt reports negative flu and covid test earlier this week.

## 2022-04-03 NOTE — ED Provider Notes (Signed)
11:20 PM  Assumed care at signout.

## 2022-04-04 NOTE — Discharge Instructions (Signed)
You may alternate Tylenol 1000 mg every 6 hours as needed for pain, fever and Ibuprofen 800 mg every 6-8 hours as needed for pain, fever.  Please take Ibuprofen with food.  Do not take more than 4000 mg of Tylenol (acetaminophen) in a 24 hour period.  I recommend over the counter medications such as Robitussin DM to help with cough.  You may also use honey that can help significantly with cough and congestion.

## 2022-04-05 ENCOUNTER — Other Ambulatory Visit (HOSPITAL_COMMUNITY)
Admission: RE | Admit: 2022-04-05 | Discharge: 2022-04-05 | Disposition: A | Discharge status: Home / Self Care | Payer: Medicaid Other | Source: Ambulatory Visit | Attending: Licensed Practical Nurse | Admitting: Licensed Practical Nurse

## 2022-04-05 ENCOUNTER — Ambulatory Visit (INDEPENDENT_AMBULATORY_CARE_PROVIDER_SITE_OTHER): Payer: Medicaid Other

## 2022-04-05 VITALS — BP 104/66 | HR 100 | Ht 61.0 in | Wt 141.0 lb

## 2022-04-05 DIAGNOSIS — N898 Other specified noninflammatory disorders of vagina: Secondary | ICD-10-CM | POA: Diagnosis not present

## 2022-04-05 NOTE — Progress Notes (Signed)
    NURSE VISIT NOTE  Subjective:    Patient ID: KALIYAN HOPPE, female    DOB: Aug 25, 2000, 22 y.o.   MRN: [id number]  HPI  Patient is a 22 y.o. [id number] female who presents for clear, white, and green vaginal discharge for 4 days  Denies abnormal vaginal bleeding or significant pelvic pain or fever. admits to some pink tint in discharge but no fever no uti SX  Patient denies history of known exposure to STD.   Objective:    Ht '5\' 1"'$  (1.549 m)   Wt 141 lb (64 kg)   LMP 03/14/2022 (Exact Date) Comment: nov 28- gave birth  Breastfeeding No   BMI 26.64 kg/m      Assessment:   1. Vaginal discharge       Plan:   GC and chlamydia DNA  probe sent to lab. ROV prn if symptoms persist or worsen.   Landis Gandy, [place]

## 2022-04-06 LAB — CERVICOVAGINAL ANCILLARY ONLY
Bacterial Vaginitis (gardnerella): NEGATIVE
Candida Glabrata: NEGATIVE
Candida Vaginitis: POSITIVE — AB
Chlamydia: NEGATIVE
Comment: NEGATIVE
Comment: NEGATIVE
Comment: NEGATIVE
Comment: NEGATIVE
Comment: NEGATIVE
Comment: NORMAL
Neisseria Gonorrhea: NEGATIVE
Trichomonas: NEGATIVE

## 2022-04-09 ENCOUNTER — Other Ambulatory Visit: Payer: Self-pay

## 2022-04-09 DIAGNOSIS — B379 Candidiasis, unspecified: Secondary | ICD-10-CM

## 2022-04-09 MED ORDER — FLUCONAZOLE 150 MG PO TABS: 150.0000 mg | ORAL_TABLET | Freq: Every day | ORAL | 0 refills | Status: DC

## 2022-04-09 NOTE — Progress Notes (Signed)
Patient aware of results, medication sent in to pharmacy

## 2022-04-10 ENCOUNTER — Ambulatory Visit: Payer: Medicaid Other | Admitting: Licensed Practical Nurse

## 2022-04-10 ENCOUNTER — Encounter: Payer: Self-pay | Admitting: Licensed Practical Nurse

## 2022-04-10 VITALS — BP 104/67 | HR 97 | Ht 61.0 in | Wt 138.0 lb

## 2022-04-10 DIAGNOSIS — Z30432 Encounter for removal of intrauterine contraceptive device: Secondary | ICD-10-CM

## 2022-04-10 NOTE — Progress Notes (Signed)
    GYNECOLOGY OFFICE PROCEDURE NOTE  Crystal Melendez is a 22 y.o. [phone] here for Mirena IUD removal. No GYN concerns.  Last pap smear was on 01/2022 and was normal. Since the IUD insertion she has had cramping and spotting, she is aware this can be normal but "just does not want it". Her partner is out of town, she does not feel she needs birth control at this time. She will use condoms. She does not desire a pregnancy now, but may want one in the future.   IUD Removal  Patient identified, informed consent performed, consent signed.  Patient was in the dorsal lithotomy position, normal external genitalia was noted.  A speculum was placed in the patient's vagina. The cervix was visualized, no lesions, no abnormal discharge, menses like bleeding present. . The strings of the IUD were grasped and pulled using ring forceps. The IUD was removed in its entirety. Showed to pt.   Patient tolerated the procedure well.    Patient will use condoms for contraception Routine preventative health maintenance measures emphasized.    Allen Derry, CNM 04/10/2022

## 2022-04-11 ENCOUNTER — Encounter: Payer: Self-pay | Admitting: Licensed Practical Nurse

## 2022-04-12 ENCOUNTER — Emergency Department
Admission: EM | Admit: 2022-04-12 | Discharge: 2022-04-12 | Disposition: A | Discharge status: Home / Self Care | Payer: Medicaid Other | Attending: Emergency Medicine | Admitting: Emergency Medicine

## 2022-04-12 ENCOUNTER — Encounter: Payer: Self-pay | Admitting: Emergency Medicine

## 2022-04-12 DIAGNOSIS — B9789 Other viral agents as the cause of diseases classified elsewhere: Secondary | ICD-10-CM | POA: Diagnosis not present

## 2022-04-12 DIAGNOSIS — H9201 Otalgia, right ear: Secondary | ICD-10-CM | POA: Diagnosis not present

## 2022-04-12 DIAGNOSIS — J028 Acute pharyngitis due to other specified organisms: Secondary | ICD-10-CM | POA: Insufficient documentation

## 2022-04-12 DIAGNOSIS — H65191 Other acute nonsuppurative otitis media, right ear: Secondary | ICD-10-CM

## 2022-04-12 DIAGNOSIS — J029 Acute pharyngitis, unspecified: Secondary | ICD-10-CM | POA: Diagnosis present

## 2022-04-12 LAB — GROUP A STREP BY PCR: Group A Strep by PCR: NOT DETECTED

## 2022-04-12 MED ORDER — PREDNISONE 10 MG PO TABS: 10.0000 mg | ORAL_TABLET | Freq: Every day | ORAL | 0 refills | Status: DC

## 2022-04-12 MED ORDER — CLARITIN-D 12 HOUR 5-120 MG PO TB12: 1.0000 | ORAL_TABLET | Freq: Two times a day (BID) | ORAL | 0 refills | Status: AC

## 2022-04-12 NOTE — Discharge Instructions (Addendum)
Please take medications as prescribed.  You may alternate Tylenol and ibuprofen as needed for pain.  Return to the ER for any fevers worsening symptoms or any urgent changes in health

## 2022-04-12 NOTE — ED Triage Notes (Signed)
Pt presents ambulatory to triage via POV with complaints of sore throat for the last 2-3 days. Endorses R ear fullness and discomfort as well. Pt notes using OTC medication and homeopathic methods without any improvement in her sx. Describes the pain as "sharp/stabbing" in the back of her throat - mild visible erythema. A&Ox4 at this time. Denies CP or SOB.

## 2022-04-12 NOTE — ED Provider Notes (Signed)
[facility] Provider Note   CSN: [id number] Arrival date & time: 04/12/22  1951     History  Chief Complaint  Patient presents with   Sore Throat    Crystal Melendez is a 22 y.o. female presents to the emergency department for evaluation of of sore throat, right ear pain.  Patient describes sinus pain and congestion.  Right ear has been painful and full with muffled sounds over the last few days.  She has had 3 days of sore throat with some drainage.  She denies any cough, fevers, chest pain, shortness of breath.  No abdominal pain nausea vomiting or diarrhea  HPI     Home Medications Prior to Admission medications   Medication Sig Start Date End Date Taking? Authorizing Provider  loratadine-pseudoephedrine (CLARITIN-D 12 HOUR) 5-120 MG tablet Take 1 tablet by mouth 2 (two) times daily for 10 days. 04/12/22 04/22/22 Yes Duanne Guess, PA-C  predniSONE (DELTASONE) 10 MG tablet Take 1 tablet (10 mg total) by mouth daily. 6,5,4,3,2,1 six day taper 04/12/22  Yes Duanne Guess, PA-C  sertraline (ZOLOFT) 50 MG tablet  11/21/21   [provider]      Allergies    Sulfa antibiotics    Review of Systems   Review of Systems  Physical Exam Updated Vital Signs BP 122/73 (BP Location: Left Arm)   Pulse 88   Temp 98.4 F (36.9 C) (Oral)   Resp 18   Ht '5\' 1"'$  (1.549 m)   Wt 62.6 kg   LMP 04/11/2022 (Exact Date) Comment: nov 28- gave birth  SpO2 100%   BMI 26.07 kg/m  Physical Exam Constitutional:      Appearance: Normal appearance. She is well-developed.  HENT:     Head: Normocephalic and atraumatic.     Comments: Right tympanic membrane full with clear fluid, intact.  No drainage, redness.    Right Ear: External ear normal.     Left Ear: External ear normal.     Nose: Nose normal. No congestion or rhinorrhea.     Mouth/Throat:     Pharynx: Oropharynx is clear. No oropharyngeal exudate or posterior oropharyngeal erythema.   Eyes:     Conjunctiva/sclera: Conjunctivae normal.  Cardiovascular:     Rate and Rhythm: Normal rate.     Pulses: Normal pulses.     Heart sounds: Normal heart sounds.  Pulmonary:     Effort: Pulmonary effort is normal. No respiratory distress.     Breath sounds: Normal breath sounds.  Abdominal:     General: Bowel sounds are normal. There is no distension.     Tenderness: There is no abdominal tenderness.  Musculoskeletal:        General: Normal range of motion.     Cervical back: Normal range of motion.  Skin:    General: Skin is warm.     Capillary Refill: Capillary refill takes less than 2 seconds.     Findings: No rash.  Neurological:     General: No focal deficit present.     Mental Status: She is alert and oriented to person, place, and time. Mental status is at baseline.  Psychiatric:        Mood and Affect: Mood normal.        Behavior: Behavior normal.        Thought Content: Thought content normal.     ED Results / Procedures / Treatments   Labs (all labs ordered are listed, but  only abnormal results are displayed) Labs Reviewed  GROUP A STREP BY PCR    EKG None  Radiology No results found.  Procedures Procedures    Medications Ordered in ED Medications - No data to display  ED Course/ Medical Decision Making/ A&P                             Medical Decision Making Risk OTC drugs. Prescription drug management.   22 year old female with sore throat, right ear pain.  Her strep test is negative.  Right ear shows no sign of any infectious process and no sign of any infectious process in her throat but she does have significant fluid and bulging of her right TM with no signs of any infectious process.  Will place on Claritin-D to help with the drainage and will place on a steroid taper.  No antibiotics indicated, vital signs stable, afebrile.  She understands signs symptoms return to the ER for. Final Clinical Impression(s) / ED Diagnoses Final  diagnoses:  Viral pharyngitis  Right ear pain  Acute MEE (middle ear effusion), right    Rx / DC Orders ED Discharge Orders          Ordered    loratadine-pseudoephedrine (CLARITIN-D 12 HOUR) 5-120 MG tablet  2 times daily        04/12/22 2142    predniSONE (DELTASONE) 10 MG tablet  Daily        04/12/22 2142              Renata Caprice 04/12/22 2155    Harvest Dark, MD 04/13/22 2123

## 2022-04-19 ENCOUNTER — Ambulatory Visit
Admission: EM | Admit: 2022-04-19 | Discharge: 2022-04-19 | Disposition: A | Discharge status: Home / Self Care | Payer: Medicaid Other | Attending: Urgent Care | Admitting: Urgent Care

## 2022-04-19 DIAGNOSIS — J019 Acute sinusitis, unspecified: Secondary | ICD-10-CM | POA: Diagnosis not present

## 2022-04-19 DIAGNOSIS — B9689 Other specified bacterial agents as the cause of diseases classified elsewhere: Secondary | ICD-10-CM | POA: Diagnosis not present

## 2022-04-19 MED ORDER — AMOXICILLIN-POT CLAVULANATE 875-125 MG PO TABS: 1.0000 | ORAL_TABLET | Freq: Two times a day (BID) | ORAL | 0 refills | Status: DC

## 2022-04-19 NOTE — ED Triage Notes (Addendum)
Patient presents to [facility] for bilateral ear pain, chest tightness, and head pressure since yesterday.States she finished her prednisone prescription yesterday that was prescribed to her from her ED visit 03/14. She is also taking Claritin. No hx of asthma.  Denies fever.

## 2022-04-19 NOTE — Discharge Instructions (Signed)
Follow up here or with your primary care provider if your symptoms are worsening or not improving.     

## 2022-04-19 NOTE — ED Provider Notes (Signed)
Roderic Palau    CSN: [id number] Arrival date & time: 04/19/22  1756      History   Chief Complaint Chief Complaint  Patient presents with   Otalgia   Headache    HPI Crystal Melendez is a 22 y.o. female.    Otalgia Associated symptoms: headaches   Headache Associated symptoms: ear pain     Presents to urgent care with bilateral ear pain, R sided head pressure, chest tightness, heart racing since yesterday.  Also reports drainage down her throat.  Patient states she was prescribed prednisone in the ED during a visit on 04/12/2022 where she was evaluated because of complaint of pharyngitis and right ear pain.  Past Medical History:  Diagnosis Date   ADHD    states when little; on no medication   Bipolar 1 disorder ([place])    states dx'ed years ago; no medication   Generalized anxiety disorder 10/06/2021    Patient Active Problem List   Diagnosis Date Noted   Generalized anxiety disorder 10/06/2021   Bipolar 2 disorder ([place]) 04/24/2017   Family history of hypercholesterolemia 09/11/2013    Past Surgical History:  Procedure Laterality Date   DENTAL SURGERY     all four age 44    OB History     Gravida  3   Para  3   Term  3   Preterm  0   AB  0   Living  3      SAB  0   IAB  0   Ectopic  0   Multiple  0   Live Births  3            Home Medications    Prior to Admission medications   Medication Sig Start Date End Date Taking? Authorizing Provider  loratadine-pseudoephedrine (CLARITIN-D 12 HOUR) 5-120 MG tablet Take 1 tablet by mouth 2 (two) times daily for 10 days. 04/12/22 04/22/22  Duanne Guess, PA-C  predniSONE (DELTASONE) 10 MG tablet Take 1 tablet (10 mg total) by mouth daily. 6,5,4,3,2,1 six day taper 04/12/22   Duanne Guess, PA-C  sertraline (ZOLOFT) 50 MG tablet  11/21/21   [provider]    Family History Family History  Problem Relation Age of Onset   Diabetes Maternal Grandmother    Bipolar  disorder Maternal Grandmother     Social History Social History   Tobacco Use   Smoking status: Never   Smokeless tobacco: Never  Vaping Use   Vaping Use: Former   Devices: last use one month ago  Substance Use Topics   Alcohol use: Not Currently    Comment: last use 4 years ago   Drug use: Not Currently    Frequency: 3.0 times per week    Types: Marijuana    Comment: last use 4 years ago     Allergies   Sulfa antibiotics   Review of Systems Review of Systems  HENT:  Positive for ear pain.   Neurological:  Positive for headaches.     Physical Exam Triage Vital Signs ED Triage Vitals [04/19/22 1822]  Enc Vitals Group     BP 114/75     Pulse Rate 99     Resp 16     Temp 98.1 F (36.7 C)     Temp Source Oral     SpO2 100 %     Weight      Height      Head Circumference  Peak Flow      Pain Score      Pain Loc      Pain Edu?      Excl. in [place]?    No data found.  Updated Vital Signs BP 114/75 (BP Location: Left Arm)   Pulse 99   Temp 98.1 F (36.7 C) (Oral)   Resp 16   LMP 04/11/2022 (Exact Date) Comment: nov 28- gave birth  SpO2 100%   Breastfeeding No   Visual Acuity Right Eye Distance:   Left Eye Distance:   Bilateral Distance:    Right Eye Near:   Left Eye Near:    Bilateral Near:     Physical Exam Vitals reviewed.  Constitutional:      Appearance: She is well-developed.  HENT:     Mouth/Throat:     Pharynx: No oropharyngeal exudate or posterior oropharyngeal erythema.  Skin:    General: Skin is warm and dry.  Neurological:     Mental Status: She is alert and oriented to person, place, and time.  Psychiatric:        Mood and Affect: Mood normal.        Behavior: Behavior normal.      UC Treatments / Results  Labs (all labs ordered are listed, but only abnormal results are displayed) Labs Reviewed - No data to display  EKG   Radiology No results found.  Procedures Procedures (including critical care  time)  Medications Ordered in UC Medications - No data to display  Initial Impression / Assessment and Plan / UC Course  I have reviewed the triage vital signs and the nursing notes.  Pertinent labs & imaging results that were available during my care of the patient were reviewed by me and considered in my medical decision making (see chart for details).   Patient is afebrile here without recent antipyretics. Satting well on room air. Overall is well appearing, well hydrated, without respiratory distress.  No pharyngeal erythema or peritonsillar exudates.  Suspect acute bacterial sinusitis secondary to a viral URI.  Possibly prednisone treatment was too early in the viral process to be effective.  Will prescribe antibacterial treatment with Augmentin.  Patient acknowledges understanding and agreement with this treatment plan.  Final Clinical Impressions(s) / UC Diagnoses   Final diagnoses:  None   Discharge Instructions   None    ED Prescriptions   None    PDMP not reviewed this encounter.   Rose Phi, [place] 04/19/22 1857

## 2022-05-10 ENCOUNTER — Ambulatory Visit (INDEPENDENT_AMBULATORY_CARE_PROVIDER_SITE_OTHER): Payer: Medicaid Other

## 2022-05-10 VITALS — BP 114/69 | HR 90 | Resp 16 | Ht 61.0 in | Wt 145.9 lb

## 2022-05-10 DIAGNOSIS — R399 Unspecified symptoms and signs involving the genitourinary system: Secondary | ICD-10-CM

## 2022-05-10 NOTE — Patient Instructions (Signed)
Urinary Tract Infection, Adult A urinary tract infection (UTI) is an infection of any part of the urinary tract. The urinary tract includes: The kidneys. The ureters. The bladder. The urethra. These organs make, store, and get rid of pee (urine) in the body. What are the causes? This infection is caused by germs (bacteria) in your genital area. These germs grow and cause swelling (inflammation) of your urinary tract. What increases the risk? The following factors may make you more likely to develop this condition: Using a small, thin tube (catheter) to drain pee. Not being able to control when you pee or poop (incontinence). Being female. If you are female, these things can increase the risk: Using these methods to prevent pregnancy: A medicine that kills sperm (spermicide). A device that blocks sperm (diaphragm). Having low levels of a female hormone (estrogen). Being pregnant. You are more likely to develop this condition if: You have genes that add to your risk. You are sexually active. You take antibiotic medicines. You have trouble peeing because of: A prostate that is bigger than normal, if you are female. A blockage in the part of your body that drains pee from the bladder. A kidney stone. A nerve condition that affects your bladder. Not getting enough to drink. Not peeing often enough. You have other conditions, such as: Diabetes. A weak disease-fighting system (immune system). Sickle cell disease. Gout. Injury of the spine. What are the signs or symptoms? Symptoms of this condition include: Needing to pee right away. Peeing small amounts often. Pain or burning when peeing. Blood in the pee. Pee that smells bad or not like normal. Trouble peeing. Pee that is cloudy. Fluid coming from the vagina, if you are female. Pain in the belly or lower back. Other symptoms include: Vomiting. Not feeling hungry. Feeling mixed up (confused). This may be the first symptom in  older adults. Being tired and grouchy (irritable). A fever. Watery poop (diarrhea). How is this treated? Taking antibiotic medicine. Taking other medicines. Drinking enough water. In some cases, you may need to see a specialist. Follow these instructions at home:  Medicines Take over-the-counter and prescription medicines only as told by your doctor. If you were prescribed an antibiotic medicine, take it as told by your doctor. Do not stop taking it even if you start to feel better. General instructions Make sure you: Pee until your bladder is empty. Do not hold pee for a long time. Empty your bladder after sex. Wipe from front to back after peeing or pooping if you are a female. Use each tissue one time when you wipe. Drink enough fluid to keep your pee pale yellow. Keep all follow-up visits. Contact a doctor if: You do not get better after 1-2 days. Your symptoms go away and then come back. Get help right away if: You have very bad back pain. You have very bad pain in your lower belly. You have a fever. You have chills. You feeling like you will vomit or you vomit. Summary A urinary tract infection (UTI) is an infection of any part of the urinary tract. This condition is caused by germs in your genital area. There are many risk factors for a UTI. Treatment includes antibiotic medicines. Drink enough fluid to keep your pee pale yellow. This information is not intended to replace advice given to you by your health care provider. Make sure you discuss any questions you have with your health care provider. Document Revised: 08/28/2019 Document Reviewed: 08/28/2019 Elsevier Patient Education    2023 [employer]vier Inc.  

## 2022-05-10 NOTE — Progress Notes (Signed)
    NURSE VISIT NOTE  Subjective:    Patient ID: ROANNA SCHAMBACH, female    DOB: Mar 08, 2000, 22 y.o.   MRN: [id number]       HPI  Patient is a 22 y.o. [id number] female who presents for dysuria, urinary frequency, flank pain, and cloudy malordorous urine for 1 day.  Patient denies hematuria, abdominal pain, pelvic pain, genital rash, genital irritation, and vaginal discharge.  Patient does not have a history of recurrent UTI.  Patient does not have a history of pyelonephritis. Patient was asked to come back when AZO was out of her system. I could not read the u/a because to the AZO she took on yesterday. I asked patient to rescheduled for a later date. I also advised her to increase her water intake, add cranberry juice and take Tylenol for pain. She verbalized understanding   Objective:    LMP 04/11/2022 (Exact Date) Comment: nov 75- gave birth   Lab Review  No results found for any visits on 05/10/22.  Assessment:   No diagnosis found.   Plan:   RTC when AZO is out of your system.      Santiago Bumpers, CMA  [facility]

## 2022-05-11 ENCOUNTER — Ambulatory Visit: Payer: Medicaid Other

## 2022-05-11 ENCOUNTER — Ambulatory Visit
Admission: EM | Admit: 2022-05-11 | Discharge: 2022-05-11 | Disposition: A | Discharge status: Home / Self Care | Payer: Medicaid Other | Attending: Urgent Care | Admitting: Urgent Care

## 2022-05-11 ENCOUNTER — Inpatient Hospital Stay: Admission: RE | Admit: 2022-05-11 | Payer: Self-pay | Source: Ambulatory Visit

## 2022-05-11 DIAGNOSIS — N3001 Acute cystitis with hematuria: Secondary | ICD-10-CM

## 2022-05-11 LAB — POCT URINALYSIS DIP (MANUAL ENTRY)
Bilirubin, UA: NEGATIVE
Glucose, UA: NEGATIVE mg/dL
Ketones, POC UA: NEGATIVE mg/dL
Nitrite, UA: NEGATIVE
Protein Ur, POC: NEGATIVE mg/dL
Spec Grav, UA: 1.01 (ref 1.010–1.025)
Urobilinogen, UA: 0.2 E.U./dL
pH, UA: 6 (ref 5.0–8.0)

## 2022-05-11 MED ORDER — CEPHALEXIN 500 MG PO CAPS
500.0000 mg | ORAL_CAPSULE | Freq: Four times a day (QID) | ORAL | 0 refills | Status: AC
Start: 2022-05-11 — End: 2022-05-16

## 2022-05-11 NOTE — ED Triage Notes (Signed)
Patient presents to [facility] for UTI. Reports urinary freq and dysuria since yesterday. Took a dose of azo yesterday.

## 2022-05-11 NOTE — ED Provider Notes (Signed)
[name]    CSN: [id number] Arrival date & time: 05/11/22  1912      History   Chief Complaint Chief Complaint  Patient presents with   Dysuria   Urinary Frequency    HPI Crystal Melendez is a 22 y.o. female.    Dysuria Urinary Frequency    Presents to urgent care with complaint of urinary frequency and dysuria starting yesterday.  Denies abdominal pain.  Denies back pain.  Patient states she is sexually active, uses contraception but sometimes has "slip ups".  Past Medical History:  Diagnosis Date   ADHD    states when little; on no medication   Bipolar 1 disorder    states dx'ed years ago; no medication   Generalized anxiety disorder 10/06/2021    Patient Active Problem List   Diagnosis Date Noted   Generalized anxiety disorder 10/06/2021   Bipolar 2 disorder 04/24/2017   Family history of hypercholesterolemia 09/11/2013    Past Surgical History:  Procedure Laterality Date   DENTAL SURGERY     all four age 61    OB History     Gravida  3   Para  3   Term  3   Preterm  0   AB  0   Living  3      SAB  0   IAB  0   Ectopic  0   Multiple  0   Live Births  3            Home Medications    Prior to Admission medications   Medication Sig Start Date End Date Taking? Authorizing Provider  sertraline (ZOLOFT) 50 MG tablet  11/21/21   [provider]    Family History Family History  Problem Relation Age of Onset   Diabetes Maternal Grandmother    Bipolar disorder Maternal Grandmother     Social History Social History   Tobacco Use   Smoking status: Never   Smokeless tobacco: Never  Vaping Use   Vaping Use: Former   Devices: last use one month ago  Substance Use Topics   Alcohol use: Not Currently    Comment: last use 4 years ago   Drug use: Not Currently    Frequency: 3.0 times per week    Types: Marijuana    Comment: last use 4 years ago     Allergies   Sulfa antibiotics   Review of  Systems Review of Systems  Genitourinary:  Positive for dysuria and frequency.     Physical Exam Triage Vital Signs ED Triage Vitals [05/11/22 1921]  Enc Vitals Group     BP 112/69     Pulse Rate (!) 102     Resp 16     Temp 98 F (36.7 C)     Temp src      SpO2 98 %     Weight      Height      Head Circumference      Peak Flow      Pain Score 0     Pain Loc      Pain Edu?      Excl. in [place]?    No data found.  Updated Vital Signs BP 112/69 (BP Location: Left Arm)   Pulse (!) 102   Temp 98 F (36.7 C)   Resp 16   LMP 04/11/2022 (Exact Date) Comment: nov 28- gave birth  SpO2 98%   Breastfeeding No   Visual  Acuity Right Eye Distance:   Left Eye Distance:   Bilateral Distance:    Right Eye Near:   Left Eye Near:    Bilateral Near:     Physical Exam Vitals reviewed.  Constitutional:      Appearance: Normal appearance.  Skin:    General: Skin is warm and dry.  Neurological:     General: No focal deficit present.     Mental Status: She is alert and oriented to person, place, and time.  Psychiatric:        Mood and Affect: Mood normal.        Behavior: Behavior normal.      UC Treatments / Results  Labs (all labs ordered are listed, but only abnormal results are displayed) Labs Reviewed  POCT URINALYSIS DIP (MANUAL ENTRY)    EKG   Radiology No results found.  Procedures Procedures (including critical care time)  Medications Ordered in UC Medications - No data to display  Initial Impression / Assessment and Plan / UC Course  I have reviewed the triage vital signs and the nursing notes.  Pertinent labs & imaging results that were available during my care of the patient were reviewed by me and considered in my medical decision making (see chart for details).   UA is suggestive of urinary tract infection with small leukocytes and trace blood.  Will treat with antibiotic therapy.  Counseled patient on potential for adverse effects with  medications prescribed/recommended today, ER and return-to-clinic precautions discussed, patient verbalized understanding and agreement with care plan.   Final Clinical Impressions(s) / UC Diagnoses   Final diagnoses:  None   Discharge Instructions   None    ED Prescriptions   None    PDMP not reviewed this encounter.   Charma Igo, [place] 05/11/22 1934

## 2022-05-11 NOTE — Discharge Instructions (Addendum)
Follow up here or with your primary care provider if your symptoms are worsening or not improving with treatment.     

## 2022-05-19 ENCOUNTER — Emergency Department: Payer: Medicaid Other

## 2022-05-19 ENCOUNTER — Emergency Department
Admission: EM | Admit: 2022-05-19 | Discharge: 2022-05-19 | Disposition: A | Discharge status: Home / Self Care | Payer: Medicaid Other | Attending: Emergency Medicine | Admitting: Emergency Medicine

## 2022-05-19 DIAGNOSIS — J069 Acute upper respiratory infection, unspecified: Secondary | ICD-10-CM | POA: Insufficient documentation

## 2022-05-19 DIAGNOSIS — R0602 Shortness of breath: Secondary | ICD-10-CM | POA: Diagnosis present

## 2022-05-19 DIAGNOSIS — Z1152 Encounter for screening for COVID-19: Secondary | ICD-10-CM | POA: Insufficient documentation

## 2022-05-19 DIAGNOSIS — R0789 Other chest pain: Secondary | ICD-10-CM | POA: Diagnosis not present

## 2022-05-19 LAB — CBC WITH DIFFERENTIAL/PLATELET
Abs Immature Granulocytes: 0.03 10*3/uL (ref 0.00–0.07)
Basophils Absolute: 0.1 10*3/uL (ref 0.0–0.1)
Basophils Relative: 1 %
Eosinophils Absolute: 0.1 10*3/uL (ref 0.0–0.5)
Eosinophils Relative: 3 %
HCT: 41.1 % (ref 36.0–46.0)
Hemoglobin: 13.6 g/dL (ref 12.0–15.0)
Immature Granulocytes: 1 %
Lymphocytes Relative: 31 %
Lymphs Abs: 1.6 10*3/uL (ref 0.7–4.0)
MCH: 30.2 pg (ref 26.0–34.0)
MCHC: 33.1 g/dL (ref 30.0–36.0)
MCV: 91.1 fL (ref 80.0–100.0)
Monocytes Absolute: 0.8 10*3/uL (ref 0.1–1.0)
Monocytes Relative: 17 %
Neutro Abs: 2.4 10*3/uL (ref 1.7–7.7)
Neutrophils Relative %: 47 %
Platelets: 247 10*3/uL (ref 150–400)
RBC: 4.51 MIL/uL (ref 3.87–5.11)
RDW: 11.8 % (ref 11.5–15.5)
WBC: 5 10*3/uL (ref 4.0–10.5)
nRBC: 0 % (ref 0.0–0.2)

## 2022-05-19 LAB — TROPONIN I (HIGH SENSITIVITY): Troponin I (High Sensitivity): <2 ng/L (ref <18)

## 2022-05-19 LAB — BASIC METABOLIC PANEL
Anion gap: 7 (ref 5–15)
BUN: 9 mg/dL (ref 6–20)
CO2: 27 mmol/L (ref 22–32)
Calcium: 9.2 mg/dL (ref 8.9–10.3)
Chloride: 104 mmol/L (ref 98–111)
Creatinine, Ser: 0.66 mg/dL (ref 0.44–1.00)
GFR, Estimated: >60 mL/min (ref >60)
Glucose, Bld: 89 mg/dL (ref 70–99)
Potassium: 3.7 mmol/L (ref 3.5–5.1)
Sodium: 138 mmol/L (ref 135–145)

## 2022-05-19 LAB — SARS CORONAVIRUS 2 BY RT PCR: SARS Coronavirus 2 by RT PCR: NEGATIVE

## 2022-05-19 LAB — HCG, QUANTITATIVE, PREGNANCY: hCG, Beta Chain, Quant, S: <1 m[IU]/mL (ref <5)

## 2022-05-19 LAB — D-DIMER, QUANTITATIVE: D-Dimer, Quant: 0.28 ug/mL-FEU (ref 0.00–0.50)

## 2022-05-19 MED ORDER — SODIUM CHLORIDE 0.9 % IV BOLUS (SEPSIS)
1000.0000 mL | Freq: Once | INTRAVENOUS | Status: AC
  Administered 2022-05-19: 1000 mL via INTRAVENOUS

## 2022-05-19 MED ORDER — KETOROLAC TROMETHAMINE 30 MG/ML IJ SOLN
30.0000 mg | Freq: Once | INTRAMUSCULAR | Status: AC
  Administered 2022-05-19: 30 mg via INTRAVENOUS
  Filled 2022-05-19: qty 1

## 2022-05-19 NOTE — Discharge Instructions (Signed)
You may alternate Tylenol 1000 mg every 6 hours as needed for pain, fever and Ibuprofen 800 mg every 6-8 hours as needed for pain, fever.  Please take Ibuprofen with food.  Do not take more than 4000 mg of Tylenol (acetaminophen) in a 24 hour period. ° °

## 2022-05-19 NOTE — ED Provider Notes (Signed)
[facility] Provider Note    Event Date/Time   First MD Initiated Contact with Patient 05/19/22 [phone]     (approximate)   History   Chest Pain   HPI  NAIRI OSWALD is a 22 y.o. female with history of ADHD, bipolar disorder, anxiety who presents to the emergency department with diffuse chest tightness and shortness of breath.  Also has had dry cough.   No history of PE, DVT, exogenous estrogen use, recent fractures, surgery, trauma, hospitalization, prolonged travel or other immobilization. No lower extremity swelling or pain. No calf tenderness.  Reports chest pain worse with deep inspiration.  No known sick contacts.   History provided by patient.    Past Medical History:  Diagnosis Date   ADHD    states when little; on no medication   Bipolar 1 disorder    states dx'ed years ago; no medication   Generalized anxiety disorder 10/06/2021    Past Surgical History:  Procedure Laterality Date   DENTAL SURGERY     all four age 70    MEDICATIONS:  Prior to Admission medications   Medication Sig Start Date End Date Taking? Authorizing Provider  sertraline (ZOLOFT) 50 MG tablet  11/21/21   [provider]    Physical Exam   Triage Vital Signs: ED Triage Vitals  Enc Vitals Group     BP 05/19/22 0338 110/62     Pulse Rate 05/19/22 0338 99     Resp 05/19/22 0338 20     Temp 05/19/22 0338 98 F (36.7 C)     Temp Source 05/19/22 0338 Oral     SpO2 05/19/22 0338 98 %     Weight 05/19/22 0333 148 lb 2.4 oz (67.2 kg)     Height 05/19/22 0333  (1.549 m)     Head Circumference --      Peak Flow --      Pain Score 05/19/22 0334 6     Pain Loc --      Pain Edu? --      Excl. in [place]? --     Most recent vital signs: Vitals:   05/19/22 0338  BP: 110/62  Pulse: 99  Resp: 20  Temp: 98 F (36.7 C)  SpO2: 98%    CONSTITUTIONAL: Alert, responds appropriately to questions. Well-appearing; well-nourished HEAD: Normocephalic,  atraumatic EYES: Conjunctivae clear, pupils appear equal, sclera nonicteric ENT: normal nose; moist mucous membranes NECK: Supple, normal ROM CARD: Regular and tachycardic; S1 and S2 appreciated RESP: Normal chest excursion without splinting or tachypnea; breath sounds clear and equal bilaterally; no wheezes, no rhonchi, no rales, no hypoxia or respiratory distress, speaking full sentences ABD/GI: Non-distended; soft, non-tender, no rebound, no guarding, no peritoneal signs BACK: The back appears normal EXT: Normal ROM in all joints; no deformity noted, no edema, no calf tenderness or calf swelling SKIN: Normal color for age and race; warm; no rash on exposed skin NEURO: Moves all extremities equally, normal speech PSYCH: The patient's mood and manner are appropriate.   ED Results / Procedures / Treatments   LABS: (all labs ordered are listed, but only abnormal results are displayed) Labs Reviewed  SARS CORONAVIRUS 2 BY RT PCR  CBC WITH DIFFERENTIAL/PLATELET  BASIC METABOLIC PANEL  D-DIMER, QUANTITATIVE  HCG, QUANTITATIVE, PREGNANCY  TROPONIN I (HIGH SENSITIVITY)     EKG:   Date: 05/19/2022 3:42 AM  Rate: 92  Rhythm: normal sinus rhythm  QRS Axis: normal  Intervals: normal  ST/T Wave abnormalities: normal  Conduction Disutrbances: none  Narrative Interpretation: unremarkable    RADIOLOGY: My personal review and interpretation of imaging: Chest x-ray clear.  I have personally reviewed all radiology reports.   DG Chest 2 View  Result Date: 05/19/2022 CLINICAL DATA:  Cough and chest pain. EXAM: CHEST - 2 VIEW COMPARISON:  April 03, 2022 FINDINGS: The heart size and mediastinal contours are within normal limits. Both lungs are clear. The visualized skeletal structures are unremarkable. IMPRESSION: No active cardiopulmonary disease. Electronically Signed   By: Aram Candela M.D.   On: 05/19/2022 04:12     PROCEDURES:  Critical Care performed: No      .1-3 Lead  EKG Interpretation  Performed by: Jamerson Vonbargen, Layla Maw, DO Authorized by: Jamie-Lee Galdamez, Layla Maw, DO     Interpretation: abnormal     ECG rate:  131   ECG rate assessment: tachycardic     Rhythm: sinus tachycardia     Ectopy: none     Conduction: normal       IMPRESSION / MDM / ASSESSMENT AND PLAN / ED COURSE  I reviewed the triage vital signs and the nursing notes.    Patient here with pleuritic chest pain, cough.  The patient is on the cardiac monitor to evaluate for evidence of arrhythmia and/or significant heart rate changes.   DIFFERENTIAL DIAGNOSIS (includes but not limited to):   Viral URI, pneumonia, bronchospasm, anemia, anxiety, PE, doubt ACS   Patient's presentation is most consistent with acute presentation with potential threat to life or bodily function.   PLAN: EKG nonischemic.  Patient did jump into the 130s with her heart rate while I was in the room.  She denies feeling anxious.  Her lungs are clear to auscultation.  Given her tachycardia and pleuritic chest pain I am concerned about PE but she does not have any risk factors.  Will obtain D-dimer.  Will check CBC, BMP, troponin x 1.  EKG nonischemic.  Will give IV fluids, Toradol for symptomatic relief.  Lungs currently clear to auscultation without hypoxia or increased work of breathing.  COVID swab pending.   MEDICATIONS GIVEN IN ED: Medications  ketorolac (TORADOL) 30 MG/ML injection 30 mg (30 mg Intravenous Given 05/19/22 0432)  sodium chloride 0.9 % bolus 1,000 mL (1,000 mLs Intravenous New Bag/Given 05/19/22 0431)     ED COURSE: Patient reports feeling better.  Chest x-ray reviewed and interpreted by myself and the radiologist and is clear.  COVID-negative.  Normal hemoglobin, electrolytes.  Negative troponin.  Negative D-dimer.  Suspect viral URI causing symptoms.  Recommended Tylenol, Motrin over-the-counter.  No indication for antibiotics today.  Patient comfortable with this plan.   At this time, I do not feel  there is any life-threatening condition present. I reviewed all nursing notes, vitals, pertinent previous records.  All lab and urine results, EKGs, imaging ordered have been independently reviewed and interpreted by myself.  I reviewed all available radiology reports from any imaging ordered this visit.  Based on my assessment, I feel the patient is safe to be discharged home without further emergent workup and can continue workup as an outpatient as needed. Discussed all findings, treatment plan as well as usual and customary return precautions.  They verbalize understanding and are comfortable with this plan.  Outpatient follow-up has been provided as needed.  All questions have been answered.    CONSULTS:  none   OUTSIDE RECORDS REVIEWED: Reviewed last internal medicine visit on 04/03/2022.  FINAL CLINICAL IMPRESSION(S) / ED DIAGNOSES   Final diagnoses:  Atypical chest pain  Viral URI with cough     Rx / DC Orders   ED Discharge Orders     None        Note:  This document was prepared using Dragon voice recognition software and may include unintentional dictation errors.   Zhavia Cunanan, Layla Maw, DO 05/19/22 [phone]

## 2022-05-19 NOTE — ED Triage Notes (Signed)
Pt presents ambulatory to triage via POV with complaints of a dry cough with associated CP when taking a deep breath. She notes the sx started a few days ago. A&Ox4 at this time. Denies fevers, chills, nasal congestion, or SOB.

## 2022-05-21 ENCOUNTER — Encounter: Payer: Self-pay | Admitting: Certified Nurse Midwife

## 2022-05-21 ENCOUNTER — Other Ambulatory Visit (HOSPITAL_COMMUNITY)
Admission: RE | Admit: 2022-05-21 | Discharge: 2022-05-21 | Disposition: A | Discharge status: Home / Self Care | Payer: Medicaid Other | Source: Ambulatory Visit | Attending: Certified Nurse Midwife | Admitting: Certified Nurse Midwife

## 2022-05-21 ENCOUNTER — Ambulatory Visit: Payer: Medicaid Other | Admitting: Certified Nurse Midwife

## 2022-05-21 VITALS — BP 100/66 | HR 103 | Resp 15 | Wt 143.6 lb

## 2022-05-21 DIAGNOSIS — N898 Other specified noninflammatory disorders of vagina: Secondary | ICD-10-CM | POA: Insufficient documentation

## 2022-05-21 MED ORDER — FLUCONAZOLE 150 MG PO TABS: 150.0000 mg | ORAL_TABLET | Freq: Once | ORAL | 0 refills | Status: AC

## 2022-05-21 NOTE — Progress Notes (Signed)
GYN ENCOUNTER NOTE  Subjective:       Crystal Melendez is a 22 y.o. [phone] female is here for gynecologic evaluation of the following issues:  1. Vaginal white , clumpy discharge and burning since Friday. She has not taken anything over the counter for it . She denies concern for STD as she is in a monogamous relationship.    Gynecologic History Patient's last menstrual period was 05/01/2022 (exact date). Contraception: condoms Last Pap: 02/07/2022. Results were: normal Last mammogram: n/a .   Obstetric History OB History  Gravida Para Term Preterm AB Living  0 0 3  SAB IAB Ectopic Multiple Live Births  0 0 0 0 3    # Outcome Date GA Lbr Len/2nd Weight Sex Delivery Anes PTL Lv  3 Term 12/26/21 [redacted]w[redacted]d / 00:10 6 lb 11.9 oz (3.06 kg) F Vag-Spont None  LIV  2 Term 07/04/20   6 lb 8 oz (2.948 kg) M Vag-Spont   LIV  1 Term 12/11/18 105w6d 04:18 / 00:11 6 lb 5.9 oz (2.89 kg) M Vag-Spont None Y LIV    Past Medical History:  Diagnosis Date   ADHD    states when little; on no medication   Bipolar 1 disorder    states dx'ed years ago; no medication   Generalized anxiety disorder 10/06/2021    Past Surgical History:  Procedure Laterality Date   DENTAL SURGERY     all four age 50    Current Outpatient Medications on File Prior to Visit  Medication Sig Dispense Refill   sertraline (ZOLOFT) 50 MG tablet      fluticasone (FLONASE) 50 MCG/ACT nasal spray Place into both nostrils. (Patient not taking: Reported on 05/21/2022)     No current facility-administered medications on file prior to visit.    Allergies  Allergen Reactions   Sulfa Antibiotics Rash    Social History   Socioeconomic History   Marital status: Legally Separated    Spouse name: Not on file   Number of children: 2   Years of education: 12   Highest education level: Not on file  Occupational History   Occupation: Consulting civil engineer  Tobacco Use   Smoking status: Never   Smokeless tobacco: Never  Vaping Use    Vaping Use: Former   Devices: last use one month ago  Substance and Sexual Activity   Alcohol use: Not Currently    Comment: last use 4 years ago   Drug use: Not Currently    Frequency: 3.0 times per week    Types: Marijuana    Comment: last use 4 years ago   Sexual activity: Yes    Comment: undecided  Other Topics Concern   Not on file  Social History Narrative   Not on file   Social Determinants of Health   Financial Resource Strain: Low Risk  (06/23/2021)   Overall Financial Resource Strain (CARDIA)    Difficulty of Paying Living Expenses: Not hard at all  Food Insecurity: No Food Insecurity (12/26/2021)   Hunger Vital Sign    Worried About Running Out of Food in the Last Year: Never true    Ran Out of Food in the Last Year: Never true  Transportation Needs: No Transportation Needs (12/26/2021)   PRAPARE - Administrator, Civil Service (Medical): No    Lack of Transportation (Non-Medical): No  Physical Activity: Not on file  Stress: No Stress Concern Present (06/23/2021)   [employer] of Occupational Health -  Occupational Stress Questionnaire    Feeling of Stress : Not at all  Social Connections: Moderately Integrated (06/23/2021)   Social Connection and Isolation Panel [NHANES]    Frequency of Communication with Friends and Family: Once a week    Frequency of Social Gatherings with Friends and Family: More than three times a week    Attends Religious Services: 1 to 4 times per year    Active Member of [employer] or Organizations: No    Attends Banker Meetings: Never    Marital Status: Married  Catering manager Violence: Not At Risk (12/26/2021)   Humiliation, Afraid, Rape, and Kick questionnaire    Fear of Current or Ex-Partner: No    Emotionally Abused: No    Physically Abused: No    Sexually Abused: No    Family History  Problem Relation Age of Onset   Diabetes Maternal Grandmother    Bipolar disorder Maternal Grandmother     The  following portions of the patient's history were reviewed and updated as appropriate: allergies, current medications, past family history, past medical history, past social history, past surgical history and problem list.  Review of Systems Review of Systems - Negative except as mentioned in HPI Review of Systems - General ROS: negative for - chills, fatigue, fever, hot flashes, malaise or night sweats Hematological and Lymphatic ROS: negative for - bleeding problems or swollen lymph nodes Gastrointestinal ROS: negative for - abdominal pain, blood in stools, change in bowel habits and nausea/vomiting Musculoskeletal ROS: negative for - joint pain, muscle pain or muscular weakness Genito-Urinary ROS: negative for - change in menstrual cycle, dysmenorrhea, dyspareunia, dysuria,  genital ulcers, hematuria, incontinence, irregular/heavy menses, nocturia or pelvic pain. Positive for white clumpy vaginal discharge and vaginal burning.   Objective:   BP 100/66   Pulse (!) 103   Resp 15   Wt 143 lb 9.6 oz (65.1 kg)   LMP 05/01/2022 (Exact Date)   BMI 27.13 kg/m  CONSTITUTIONAL: Well-developed, well-nourished female in no acute distress.  HENT:  Normocephalic, atraumatic.  NECK: Normal range of motion, supple, no masses.  Normal thyroid.  SKIN: Skin is warm and dry. No rash noted. Not diaphoretic. No erythema. No pallor. NEUROLGIC: Alert and oriented to person, place, and time. PSYCHIATRIC: Normal mood and affect. Normal behavior. Normal judgment and thought content. CARDIOVASCULAR:Not Examined RESPIRATORY: Not Examined BREASTS: Not Examined ABDOMEN: Soft, non distended; Non tender.  No Organomegaly. PELVIC:  External Genitalia: Normal  BUS: Normal  Vagina: Normal. Mild redness, white discharge, no odor  Cervix: Normal  MUSCULOSKELETAL: Normal range of motion. No tenderness.  No cyanosis, clubbing, or edema.     Assessment:   1. Vaginal discharge 2. Vaginal burning     Plan:    Swab collected. White discharge  probable yeast. Order placed for diflucan. Discussed treatment now and if swab positive for yeast and bv with repeat yeast treatment with flagyl. She verbalizes and agrees to plan.   Doreene Burke, CNM

## 2022-05-21 NOTE — Patient Instructions (Signed)

## 2022-05-23 ENCOUNTER — Encounter: Payer: Self-pay | Admitting: Certified Nurse Midwife

## 2022-05-23 LAB — CERVICOVAGINAL ANCILLARY ONLY
Bacterial Vaginitis (gardnerella): NEGATIVE
Candida Glabrata: NEGATIVE
Candida Vaginitis: POSITIVE — AB
Comment: NEGATIVE
Comment: NEGATIVE
Comment: NEGATIVE

## 2022-06-05 ENCOUNTER — Emergency Department
Admission: EM | Admit: 2022-06-05 | Discharge: 2022-06-06 | Disposition: A | Discharge status: Home / Self Care | Payer: Medicaid Other | Attending: Emergency Medicine | Admitting: Emergency Medicine

## 2022-06-05 ENCOUNTER — Emergency Department: Payer: Medicaid Other

## 2022-06-05 ENCOUNTER — Other Ambulatory Visit: Payer: Self-pay

## 2022-06-05 DIAGNOSIS — K2100 Gastro-esophageal reflux disease with esophagitis, without bleeding: Secondary | ICD-10-CM | POA: Insufficient documentation

## 2022-06-05 DIAGNOSIS — R072 Precordial pain: Secondary | ICD-10-CM | POA: Diagnosis present

## 2022-06-05 DIAGNOSIS — R079 Chest pain, unspecified: Secondary | ICD-10-CM

## 2022-06-05 LAB — COMPREHENSIVE METABOLIC PANEL
ALT: 17 U/L (ref 0–44)
AST: 16 U/L (ref 15–41)
Albumin: 4.7 g/dL (ref 3.5–5.0)
Alkaline Phosphatase: 61 U/L (ref 38–126)
Anion gap: 7 (ref 5–15)
BUN: 9 mg/dL (ref 6–20)
CO2: 28 mmol/L (ref 22–32)
Calcium: 9.7 mg/dL (ref 8.9–10.3)
Chloride: 102 mmol/L (ref 98–111)
Creatinine, Ser: 0.51 mg/dL (ref 0.44–1.00)
GFR, Estimated: >60 mL/min (ref >60)
Glucose, Bld: 112 mg/dL — ABNORMAL HIGH (ref 70–99)
Potassium: 3.9 mmol/L (ref 3.5–5.1)
Sodium: 137 mmol/L (ref 135–145)
Total Bilirubin: 0.7 mg/dL (ref 0.3–1.2)
Total Protein: 8 g/dL (ref 6.5–8.1)

## 2022-06-05 LAB — CBC
HCT: 41.3 % (ref 36.0–46.0)
Hemoglobin: 13.8 g/dL (ref 12.0–15.0)
MCH: 30.1 pg (ref 26.0–34.0)
MCHC: 33.4 g/dL (ref 30.0–36.0)
MCV: 90.2 fL (ref 80.0–100.0)
Platelets: 335 10*3/uL (ref 150–400)
RBC: 4.58 MIL/uL (ref 3.87–5.11)
RDW: 11.7 % (ref 11.5–15.5)
WBC: 7.8 10*3/uL (ref 4.0–10.5)
nRBC: 0 % (ref 0.0–0.2)

## 2022-06-05 LAB — TROPONIN I (HIGH SENSITIVITY)
Troponin I (High Sensitivity): <2 ng/L (ref <18)
Troponin I (High Sensitivity): <2 ng/L (ref <18)

## 2022-06-05 LAB — LIPASE, BLOOD: Lipase: 28 U/L (ref 11–51)

## 2022-06-05 LAB — POC URINE PREG, ED: Preg Test, Ur: NEGATIVE

## 2022-06-05 MED ORDER — LIDOCAINE VISCOUS HCL 2 % MT SOLN
15.0000 mL | Freq: Once | OROMUCOSAL | Status: AC
  Administered 2022-06-06: 15 mL via OROMUCOSAL
  Filled 2022-06-05: qty 15

## 2022-06-05 MED ORDER — ALUM & MAG HYDROXIDE-SIMETH 200-200-20 MG/5ML PO SUSP
30.0000 mL | Freq: Once | ORAL | Status: AC
  Administered 2022-06-06: 30 mL via ORAL
  Filled 2022-06-05: qty 30

## 2022-06-05 MED ORDER — FAMOTIDINE IN NACL 20-0.9 MG/50ML-% IV SOLN
20.0000 mg | Freq: Once | INTRAVENOUS | Status: AC
  Administered 2022-06-06: 20 mg via INTRAVENOUS
  Filled 2022-06-05: qty 50

## 2022-06-05 NOTE — ED Triage Notes (Signed)
Pt presents to ER with c/o chest pain that started this morning.  Pt states she has felt all day like she has had "the worst indigestion of my life."  Pt describes the pain as a heaviness in the center of her chest and is non-radiating, and intermittent.   Pt reports being on prednisone recently for URI, but states she is supposed to finish the meds tomorrow.  Pt is otherwise A&O x4 and in NAD in triage.

## 2022-06-05 NOTE — ED Provider Notes (Signed)
[facility] Provider Note    Event Date/Time   First MD Initiated Contact with Patient 06/05/22 2339     (approximate)   History   Chest Pain   HPI  Crystal Melendez is a 22 y.o. female who presents to the ED from home with a chief complaint of chest pain.  Patient reports midsternal chest pain which began this morning.  Describes it as "the worst indigestion of my life".  Feels like there is a heaviness in the center of her chest that is nonradiating, waxing/waning.  Denies preceding alcohol or spicy foods.  Patient is almost finished with a 10-day course of 10 mg daily prednisone which she was recently prescribed for URI.  Denies fever/chills, shortness of breath, abdominal pain, nausea, vomiting or dizziness.  Denies recent travel, trauma or hormone use.     Past Medical History   Past Medical History:  Diagnosis Date   ADHD    states when little; on no medication   Bipolar 1 disorder (HCC)    states dx'ed years ago; no medication   Generalized anxiety disorder 10/06/2021     Active Problem List   Patient Active Problem List   Diagnosis Date Noted   Generalized anxiety disorder 10/06/2021   Bipolar 2 disorder (HCC) 04/24/2017   Family history of hypercholesterolemia 09/11/2013     Past Surgical History   Past Surgical History:  Procedure Laterality Date   DENTAL SURGERY     all four age 74     Home Medications   Prior to Admission medications   Medication Sig Start Date End Date Taking? Authorizing Provider  famotidine (PEPCID) 20 MG tablet Take 1 tablet (20 mg total) by mouth 2 (two) times daily. 06/06/22  Yes Irean Hong, MD  sucralfate (CARAFATE) 1 GM/10ML suspension Take 10 mLs (1 g total) by mouth 4 (four) times daily. 06/06/22  Yes Irean Hong, MD  fluticasone (FLONASE) 50 MCG/ACT nasal spray Place into both nostrils. Patient not taking: Reported on 05/21/2022    [provider]  sertraline (ZOLOFT) 50 MG tablet  11/21/21    [provider]     Allergies  Sulfa antibiotics   Family History   Family History  Problem Relation Age of Onset   Diabetes Maternal Grandmother    Bipolar disorder Maternal Grandmother      Physical Exam  Triage Vital Signs: ED Triage Vitals  Enc Vitals Group     BP 06/05/22 1932 116/85     Pulse Rate 06/05/22 1932 86     Resp 06/05/22 1932 15     Temp 06/05/22 1932 98.2 F (36.8 C)     Temp Source 06/05/22 1932 Oral     SpO2 06/05/22 1932 98 %     Weight 06/05/22 1933 140 lb (63.5 kg)     Height 06/05/22 1933 5\' 1"  (1.549 m)     Head Circumference --      Peak Flow --      Pain Score 06/05/22 1932 5     Pain Loc --      Pain Edu? --      Excl. in GC? --     Updated Vital Signs: BP 107/73   Pulse 72   Temp 98.2 F (36.8 C) (Oral)   Resp 19   Ht 5\' 1"  (1.549 m)   Wt 63.5 kg   LMP 05/01/2022 (Exact Date)   SpO2 98%   BMI 26.45 kg/m  General: Awake, no distress.  CV:  RRR.  Good peripheral perfusion.  Resp:  Normal effort.  CTAB.  Central chest tender to palpation. Abd:  Nontender.  No distention.  Other:  Bilateral calves are supple and nontender.   ED Results / Procedures / Treatments  Labs (all labs ordered are listed, but only abnormal results are displayed) Labs Reviewed  COMPREHENSIVE METABOLIC PANEL - Abnormal; Notable for the following components:      Result Value   Glucose, Bld 112 (*)    All other components within normal limits  CBC  LIPASE, BLOOD  D-DIMER, QUANTITATIVE  POC URINE PREG, ED  TROPONIN I (HIGH SENSITIVITY)  TROPONIN I (HIGH SENSITIVITY)     EKG  ED ECG REPORT I, Jezabella Schriever J, the attending physician, personally viewed and interpreted this ECG.   Date: 06/06/2022  EKG Time: 1930  Rate: 85  Rhythm: normal sinus rhythm  Axis: Normal  Intervals:none  ST&T Change: Nonspecific    RADIOLOGY I have independently visualized and interpreted patient's chest x-ray as well as noted the radiology  interpretation:  X-ray: No acute cardiopulmonary process  Official radiology report(s): DG Chest 1 View  Result Date: 06/05/2022 CLINICAL DATA:  Chest pain. EXAM: CHEST  1 VIEW COMPARISON:  May 19, 2022. FINDINGS: The heart size and mediastinal contours are within normal limits. Both lungs are clear. The visualized skeletal structures are unremarkable. IMPRESSION: No active disease. Electronically Signed   By: Lupita Raider M.D.   On: 06/05/2022 20:16     PROCEDURES:  Critical Care performed: No  Procedures   MEDICATIONS ORDERED IN ED: Medications  famotidine (PEPCID) IVPB 20 mg premix (0 mg Intravenous Stopped 06/06/22 0111)  alum & mag hydroxide-simeth (MAALOX/MYLANTA) 200-200-20 MG/5ML suspension 30 mL (30 mLs Oral Given 06/06/22 0017)  lidocaine (XYLOCAINE) 2 % viscous mouth solution 15 mL (15 mLs Mouth/Throat Given 06/06/22 0017)     IMPRESSION / MDM / ASSESSMENT AND PLAN / ED COURSE  I reviewed the triage vital signs and the nursing notes.                             22 year old female presenting with central chest pain. Differential diagnosis includes, but is not limited to, ACS, aortic dissection, pulmonary embolism, cardiac tamponade, pneumothorax, pneumonia, pericarditis, myocarditis, GI-related causes including esophagitis/gastritis, and musculoskeletal chest wall pain.   I personally reviewed patient's records and note a psychiatry telemedicine visit for GAD on 05/29/2022 and a PCP office visit on 05/28/2022 for postnasal drip.  Patient's presentation is most consistent with acute presentation with potential threat to life or bodily function.  The patient is on the cardiac monitor to evaluate for evidence of arrhythmia and/or significant heart rate changes.  Laboratory results demonstrate normal WBC of 7.8, normal electrolytes including LFTs/lipase, 2 sets of negative troponins.  Chest x-ray is clear.  Will check D-dimer, administer IV Pepcid, GI cocktail and  reassess.  Clinical Course as of 06/06/22 1610  Wed Jun 06, 2022  0102 Updated patient on negative D-dimer.  She is feeling significantly better.  Will discharge home on Carafate, Pepcid and refer to cardiology for outpatient follow-up.  Strict return precautions given.  Patient verbalizes understanding and agrees with plan of care. [JS]    Clinical Course User Index [JS] Irean Hong, MD     FINAL CLINICAL IMPRESSION(S) / ED DIAGNOSES   Final diagnoses:  Nonspecific chest pain  Gastroesophageal reflux disease with esophagitis  without hemorrhage     Rx / DC Orders   ED Discharge Orders          Ordered    sucralfate (CARAFATE) 1 GM/10ML suspension  4 times daily        06/06/22 0105    famotidine (PEPCID) 20 MG tablet  2 times daily        06/06/22 0105    Ambulatory referral to Cardiology       Comments: If you have not heard from the Cardiology office within the next 72 hours please call [phone].   06/06/22 0107             Note:  This document was prepared using Dragon voice recognition software and may include unintentional dictation errors.   Irean Hong, MD 06/06/22 [phone]

## 2022-06-06 LAB — D-DIMER, QUANTITATIVE: D-Dimer, Quant: <0.27 ug/mL-FEU (ref 0.00–0.50)

## 2022-06-06 MED ORDER — SUCRALFATE 1 GM/10ML PO SUSP: 1.0000 g | Freq: Four times a day (QID) | ORAL | 1 refills | Status: DC

## 2022-06-06 MED ORDER — FAMOTIDINE 20 MG PO TABS: 20.0000 mg | ORAL_TABLET | Freq: Two times a day (BID) | ORAL | 0 refills | Status: DC

## 2022-06-06 NOTE — Discharge Instructions (Addendum)
Start medicines as directed: Pepcid 20mg  twice daily Carafate 3 times daily with meals and at bedtime 2. Return to the ER for worsening symptoms, persistent vomiting, difficulty breathing or other concerns.

## 2022-06-14 ENCOUNTER — Encounter: Payer: Self-pay | Admitting: Physician Assistant

## 2022-06-14 ENCOUNTER — Ambulatory Visit: Payer: Medicaid Other | Admitting: Licensed Practical Nurse

## 2022-06-14 ENCOUNTER — Ambulatory Visit: Payer: Medicaid Other | Admitting: Physician Assistant

## 2022-06-14 VITALS — BP 113/78 | HR 88 | Temp 98.1°F | Ht 61.0 in | Wt 141.8 lb

## 2022-06-14 DIAGNOSIS — K219 Gastro-esophageal reflux disease without esophagitis: Secondary | ICD-10-CM | POA: Diagnosis not present

## 2022-06-14 NOTE — Progress Notes (Signed)
Crystal Melendez [address]  [address]  [place], [place] [address]  Main: [phone]  Fax: [phone]   Gastroenterology Consultation  Referring Provider:     Nira Melendez Melendez Care Melendez:  Crystal Melendez Gastroenterologist:  Crystal Melendez  Reason for Consultation:     ED F/U GERD and chest pain        HPI:   Crystal Melendez is a 22 y.o. y/o female referred for consultation & management  by Crystal Melendez.    She went to [facility] ED to evaluate atypical chest pain on 05/19/2022 and again on 06/05/2022.  Negative cardiac workup.  Normal chest x-rays and EKG.  Labs showed normal lipase, negative troponin, negative D-dimer, negative pregnancy test.  Normal CBC and CMP.  Hemoglobin 13.8.  She was given GI cocktail with benefit.  She followed up with her PCP who started her on Meprazole 20 mg once daily.  Since then she has not had any more episodes of chest pain.  She denies dysphagia.  Has not had any recent abdominal pain.  No alarm symptoms.  No previous EGD or GI evaluation.  Abdominal pelvic CT to evaluate RLQ pain 03/2022 showed no acute abnormality.  Past Medical History:  Diagnosis Date   ADHD    states when little; on no medication   Bipolar 1 disorder (HCC)    states dx'ed years ago; no medication   Generalized anxiety disorder 10/06/2021    Past Surgical History:  Procedure Laterality Date   DENTAL SURGERY     all four age 22    Prior to Admission medications   Medication Sig Start Date End Date Taking? Authorizing Provider  fluticasone (FLONASE) 50 MCG/ACT nasal spray Place into both nostrils.   Yes [provider]  nitrofurantoin, macrocrystal-monohydrate, (MACROBID) 100 MG capsule Take 100 mg by mouth. 06/12/22 06/17/22 Yes [provider]  omeprazole (PRILOSEC) 20 MG capsule Take 20 mg by mouth daily. 06/07/22  Yes [provider]  sertraline (ZOLOFT) 50 MG tablet  11/21/21  Yes [provider]    Family History  Problem Relation Age of Onset   Diabetes Maternal Grandmother    Bipolar disorder Maternal Grandmother      Social History   Tobacco Use   Smoking status: Never   Smokeless tobacco: Never  Vaping Use   Vaping Use: Former   Devices: last use one month ago  Substance Use Topics   Alcohol use: Not Currently    Comment: last use 4 years ago   Drug use: Not Currently    Frequency: 3.0 times per week    Types: Marijuana    Comment: last use 4 years ago    Allergies as of 06/14/2022 - Review Complete 06/14/2022  Allergen Reaction Noted   Sulfa antibiotics Rash 04/23/2017    Review of Systems:    All systems reviewed and negative except where noted in HPI.   Physical Exam:  BP 113/78   Pulse 88   Temp 98.1 F (36.7 C)   Ht 5\' 1"  (1.549 m)   Wt 141 lb 12.8 oz (64.3 kg)   LMP 05/01/2022 (Exact Date)   BMI 26.79 kg/m  Patient's last menstrual period was 05/01/2022 (exact date). Psych:  Alert and cooperative. Normal Melendez and affect. General:   Alert,  Well-developed, well-nourished, pleasant and cooperative in NAD Head:  Normocephalic and atraumatic. Eyes:  Sclera clear, no icterus.   Conjunctiva pink. Neck:  Supple; no masses  or thyromegaly. Lungs:  Respirations even and unlabored.  Clear throughout to auscultation.   No wheezes, crackles, or rhonchi. No acute distress. Heart:  Regular rate and rhythm; no murmurs, clicks, rubs, or gallops. Abdomen:  Normal bowel sounds.  No bruits.  Soft, and non-distended without masses, hepatosplenomegaly or hernias noted.  No Tenderness.  No guarding or rebound tenderness.    Neurologic:  Alert and oriented x3;  grossly normal neurologically. Psych:  Alert and cooperative. Normal Melendez and affect.  Imaging Studies: DG Chest 1 View  Result Date: 06/05/2022 CLINICAL DATA:  Chest pain. EXAM: CHEST  1 VIEW COMPARISON:  May 19, 2022. FINDINGS: The heart size and mediastinal contours are within normal  limits. Both lungs are clear. The visualized skeletal structures are unremarkable. IMPRESSION: No active disease. Electronically Signed   By: Lupita Raider M.D.   On: 06/05/2022 20:16   DG Chest 2 View  Result Date: 05/19/2022 CLINICAL DATA:  Cough and chest pain. EXAM: CHEST - 2 VIEW COMPARISON:  April 03, 2022 FINDINGS: The heart size and mediastinal contours are within normal limits. Both lungs are clear. The visualized skeletal structures are unremarkable. IMPRESSION: No active cardiopulmonary disease. Electronically Signed   By: Aram Candela M.D.   On: 05/19/2022 04:12    Assessment and Plan:   Crystal Melendez is a 22 y.o. y/o female has been referred for GERD.  GERD  Controlled on Omeprazole 20mg  1 tablet once daily. Recommend Lifestyle Modifications to prevent Acid Reflux.  Rec. Avoid coffee, sodas, peppermint, citrus fruits, and spicey foods.  Avoid eating 2-3 hours before bedtime.   Patient education was given regarding acid reflux.  Reassurance regarding recent normal labs, chest x-ray, and EKG.  Follow up in As Needed if symptoms return.  Crystal Melendez

## 2022-06-14 NOTE — Patient Instructions (Signed)

## 2022-06-26 ENCOUNTER — Ambulatory Visit: Payer: Medicaid Other | Admitting: Physician Assistant

## 2022-07-11 ENCOUNTER — Ambulatory Visit: Payer: Medicaid Other | Admitting: Certified Nurse Midwife

## 2022-07-16 ENCOUNTER — Ambulatory Visit: Payer: Medicaid Other | Attending: Internal Medicine | Admitting: Internal Medicine

## 2022-07-16 ENCOUNTER — Encounter: Payer: Self-pay | Admitting: Internal Medicine

## 2022-07-16 VITALS — BP 104/64 | HR 81 | Ht 61.0 in | Wt 141.2 lb

## 2022-07-16 DIAGNOSIS — Z8342 Family history of familial hypercholesterolemia: Secondary | ICD-10-CM

## 2022-07-16 NOTE — Progress Notes (Signed)
Cardiology Office Note:    Date:  07/16/2022   ID:  Crystal Melendez, DOB [birth year], MRN [id number]  PCP:  [facility] Providers Cardiologist:  Maisie Fus, MD     Referring MD: Nira Retort   No chief complaint on file. CP  History of Present Illness:    Crystal Melendez is a 22 y.o. female with a hx of ADHD, bipolar referral for CP. She notes tight and heavy feeling in her chest. Has a similar feeling in her elbow at the same time.  Takes a hot shower for relief.  Negative ACS work up in the ED. She was referred to cardiology  Past Medical History:  Diagnosis Date   ADHD    states when little; on no medication   Bipolar 1 disorder (HCC)    states dx'ed years ago; no medication   Generalized anxiety disorder 10/06/2021    Past Surgical History:  Procedure Laterality Date   DENTAL SURGERY     all four age 31    Current Medications: Current Outpatient Medications on File Prior to Visit  Medication Sig Dispense Refill   fluticasone (FLONASE) 50 MCG/ACT nasal spray Place into both nostrils.     omeprazole (PRILOSEC) 20 MG capsule Take 20 mg by mouth daily.     No current facility-administered medications on file prior to visit.     Allergies:   Sulfa antibiotics   Social History   Socioeconomic History   Marital status: Legally Separated    Spouse name: Not on file   Number of children: 2   Years of education: 12   Highest education level: Not on file  Occupational History   Occupation: Consulting civil engineer  Tobacco Use   Smoking status: Never   Smokeless tobacco: Never  Vaping Use   Vaping Use: Former   Devices: last use one month ago  Substance and Sexual Activity   Alcohol use: Not Currently    Comment: last use 4 years ago   Drug use: Not Currently    Frequency: 3.0 times per week    Types: Marijuana    Comment: last use 4 years ago   Sexual activity: Yes    Comment: undecided  Other Topics Concern   Not on file   Social History Narrative   Not on file   Social Determinants of Health   Financial Resource Strain: Low Risk  (06/23/2021)   Overall Financial Resource Strain (CARDIA)    Difficulty of Paying Living Expenses: Not hard at all  Food Insecurity: No Food Insecurity (12/26/2021)   Hunger Vital Sign    Worried About Running Out of Food in the Last Year: Never true    Ran Out of Food in the Last Year: Never true  Transportation Needs: No Transportation Needs (12/26/2021)   PRAPARE - Administrator, Civil Service (Medical): No    Lack of Transportation (Non-Medical): No  Physical Activity: Not on file  Stress: No Stress Concern Present (06/23/2021)   [employer] of Occupational Health - Occupational Stress Questionnaire    Feeling of Stress : Not at all  Social Connections: Moderately Integrated (06/23/2021)   Social Connection and Isolation Panel [NHANES]    Frequency of Communication with Friends and Family: Once a week    Frequency of Social Gatherings with Friends and Family: More than three times a week    Attends Religious Services: 1 to 4 times per year    Active Member of  Clubs or Organizations: No    Attends Banker Meetings: Never    Marital Status: Married     Family History: The patient's family history includes Bipolar disorder in her maternal grandmother; Diabetes in her maternal grandmother.  ROS:   Please see the history of present illness.     All other systems reviewed and are negative.  EKGs/Labs/Other Studies Reviewed:    The following studies were reviewed today:   EKG:  EKG is  ordered today.  The ekg ordered today demonstrates   07/16/2022- NSR with short PR  Recent Labs: 06/05/2022: ALT 17; BUN 9; Creatinine, Ser 0.51; Hemoglobin 13.8; Platelets 335; Potassium 3.9; Sodium 137  Recent Lipid Panel    Component Value Date/Time   CHOL 174 (H) 04/26/2017 0649   TRIG 108 04/26/2017 0649   HDL 39 (L) 04/26/2017 0649   CHOLHDL  4.5 04/26/2017 0649   VLDL 22 04/26/2017 0649   LDLCALC 113 (H) 04/26/2017 0649     Risk Assessment/Calculations:     Physical Exam:    VS:   Vitals:   07/16/22 0834  BP: 104/64  Pulse: 81    Wt Readings from Last 3 Encounters:  07/16/22 141 lb 3.2 oz (64 kg)  06/14/22 141 lb 12.8 oz (64.3 kg)  06/05/22 140 lb (63.5 kg)     GEN:  Well nourished, well developed in no acute distress HEENT: Normal NECK: No JVD; CARDIAC: RRR, no murmurs, rubs, gallops RESPIRATORY:  Clear to auscultation without rales, wheezing or rhonchi  ABDOMEN: Soft, non-tender, non-distended MUSCULOSKELETAL:  No edema; No deformity  SKIN: Warm and dry NEUROLOGIC:  Alert and oriented x 3 PSYCHIATRIC:  Normal affect   ASSESSMENT:    Non cardiac CP: She is low risk for coronary dx, no signs of HCM.  PLAN:    In order of problems listed above:  No further cardiac wu      Medication Adjustments/Labs and Tests Ordered: Current medicines are reviewed at length with the patient today.  Concerns regarding medicines are outlined above.  Orders Placed This Encounter  Procedures   EKG 12-Lead   No orders of the defined types were placed in this encounter.   Patient Instructions  Medication Instructions:  Your physician recommends that you continue on your current medications as directed. Please refer to the Current Medication list given to you today.  *If you need a refill on your cardiac medications before your next appointment, please call your pharmacy*   Lab Work: None   Testing/Procedures: None   Follow-Up: At [facility], you and your health needs are our priority.  As part of our continuing mission to provide you with exceptional heart care, we have created designated Provider Care Teams.  These Care Teams include your primary Cardiologist (physician) and Advanced Practice Providers (APPs -  Physician Assistants and Nurse Practitioners) who all work together to provide you  with the care you need, when you need it.  We recommend signing up for the patient portal called "MyChart".  Sign up information is provided on this After Visit Summary.  MyChart is used to connect with patients for Virtual Visits (Telemedicine).  Patients are able to view lab/test results, encounter notes, upcoming appointments, etc.  Non-urgent messages can be sent to your provider as well.   To learn more about what you can do with MyChart, go to [email or web address].    Your next appointment:    As needed  Provider:   Carolan Clines, MD  Signed, Maisie Fus, MD  07/16/2022 9:05 AM    [facility]

## 2022-07-16 NOTE — Patient Instructions (Signed)
Medication Instructions:  Your physician recommends that you continue on your current medications as directed. Please refer to the Current Medication list given to you today.  *If you need a refill on your cardiac medications before your next appointment, please call your pharmacy*   Lab Work: None   Testing/Procedures: None   Follow-Up: At [facility], you and your health needs are our priority.  As part of our continuing mission to provide you with exceptional heart care, we have created designated Provider Care Teams.  These Care Teams include your primary Cardiologist (physician) and Advanced Practice Providers (APPs -  Physician Assistants and Nurse Practitioners) who all work together to provide you with the care you need, when you need it.  We recommend signing up for the patient portal called "MyChart".  Sign up information is provided on this After Visit Summary.  MyChart is used to connect with patients for Virtual Visits (Telemedicine).  Patients are able to view lab/test results, encounter notes, upcoming appointments, etc.  Non-urgent messages can be sent to your provider as well.   To learn more about what you can do with MyChart, go to [email or web address].    Your next appointment:    As needed  Provider:   Mary Branch, MD   

## 2022-07-30 ENCOUNTER — Encounter: Payer: Self-pay | Admitting: Emergency Medicine

## 2022-07-30 ENCOUNTER — Other Ambulatory Visit: Payer: Self-pay

## 2022-07-30 ENCOUNTER — Emergency Department
Admission: EM | Admit: 2022-07-30 | Discharge: 2022-07-30 | Disposition: A | Discharge status: Home / Self Care | Payer: Medicaid Other | Attending: Emergency Medicine | Admitting: Emergency Medicine

## 2022-07-30 DIAGNOSIS — M545 Low back pain, unspecified: Secondary | ICD-10-CM | POA: Diagnosis present

## 2022-07-30 LAB — URINALYSIS, ROUTINE W REFLEX MICROSCOPIC
Bilirubin Urine: NEGATIVE
Glucose, UA: NEGATIVE mg/dL
Hgb urine dipstick: NEGATIVE
Ketones, ur: NEGATIVE mg/dL
Leukocytes,Ua: NEGATIVE
Nitrite: NEGATIVE
Protein, ur: NEGATIVE mg/dL
Specific Gravity, Urine: 1.004 — ABNORMAL LOW (ref 1.005–1.030)
pH: 6 (ref 5.0–8.0)

## 2022-07-30 LAB — BASIC METABOLIC PANEL
Anion gap: 10 (ref 5–15)
BUN: 10 mg/dL (ref 6–20)
CO2: 24 mmol/L (ref 22–32)
Calcium: 9.7 mg/dL (ref 8.9–10.3)
Chloride: 106 mmol/L (ref 98–111)
Creatinine, Ser: 0.63 mg/dL (ref 0.44–1.00)
GFR, Estimated: >60 mL/min (ref >60)
Glucose, Bld: 101 mg/dL — ABNORMAL HIGH (ref 70–99)
Potassium: 3.9 mmol/L (ref 3.5–5.1)
Sodium: 140 mmol/L (ref 135–145)

## 2022-07-30 LAB — CBC
HCT: 43.6 % (ref 36.0–46.0)
Hemoglobin: 14.5 g/dL (ref 12.0–15.0)
MCH: 29.8 pg (ref 26.0–34.0)
MCHC: 33.3 g/dL (ref 30.0–36.0)
MCV: 89.7 fL (ref 80.0–100.0)
Platelets: 338 10*3/uL (ref 150–400)
RBC: 4.86 MIL/uL (ref 3.87–5.11)
RDW: 11.5 % (ref 11.5–15.5)
WBC: 6.6 10*3/uL (ref 4.0–10.5)
nRBC: 0 % (ref 0.0–0.2)

## 2022-07-30 MED ORDER — LIDOCAINE 5 % EX PTCH: 1.0000 | MEDICATED_PATCH | Freq: Two times a day (BID) | CUTANEOUS | 0 refills | Status: AC | PRN

## 2022-07-30 NOTE — ED Notes (Signed)
See triage note  Presents with lower back pain  States recently finished meds for UTI  States pain is across lower back  but doesn't radiate into legs  Ambulates well to treatment room

## 2022-07-30 NOTE — ED Provider Notes (Signed)
[facility] Emergency Department Provider Note     Event Date/Time   First MD Initiated Contact with Patient 07/30/22 1510     (approximate)   History   Back Pain   HPI  CHYRL POMROY is a 22 y.o. female with a h/o GAD, ADHD, and biopolar 1 disorder, presents to the ED for evaluation of lower back pain with referral to the hips. She denies any urinary symptoms. She recently completed a course of antibiotics for a UTI.   Physical Exam   Triage Vital Signs: ED Triage Vitals  Enc Vitals Group     BP 07/30/22 1352 114/81     Pulse Rate 07/30/22 1352 (!) 110     Resp 07/30/22 1352 18     Temp 07/30/22 1352 98.2 F (36.8 C)     Temp Source 07/30/22 1352 Oral     SpO2 07/30/22 1352 99 %     Weight 07/30/22 1403 141 lb 1.5 oz (64 kg)     Height 07/30/22 1403 5\' 1"  (1.549 m)     Head Circumference --      Peak Flow --      Pain Score 07/30/22 1353 8     Pain Loc --      Pain Edu? --      Excl. in GC? --     Most recent vital signs: Vitals:   07/30/22 1352  BP: 114/81  Pulse: (!) 110  Resp: 18  Temp: 98.2 F (36.8 C)  SpO2: 99%    General Awake, no distress. *** {**HEENT NCAT. PERRL. EOMI. No rhinorrhea. Mucous membranes are moist. **} CV:  Good peripheral perfusion. *** RESP:  Normal effort. *** ABD:  No distention. *** {**Other: **}   ED Results / Procedures / Treatments   Labs (all labs ordered are listed, but only abnormal results are displayed) Labs Reviewed  BASIC METABOLIC PANEL - Abnormal; Notable for the following components:      Result Value   Glucose, Bld 101 (*)    All other components within normal limits  URINALYSIS, ROUTINE W REFLEX MICROSCOPIC - Abnormal; Notable for the following components:   Color, Urine STRAW (*)    APPearance CLEAR (*)    Specific Gravity, Urine 1.004 (*)    All other components within normal limits  CBC     EKG   RADIOLOGY  {**I personally viewed and evaluated these images as  part of my medical decision making, as well as reviewing the written report by the radiologist.  ED Provider Interpretation: ***  No results found.   PROCEDURES:  Critical Care performed: No  Procedures   MEDICATIONS ORDERED IN ED: Medications - No data to display   IMPRESSION / MDM / ASSESSMENT AND PLAN / ED COURSE  I reviewed the triage vital signs and the nursing notes.                              Differential diagnosis includes, but is not limited to, ***  Patient's presentation is most consistent with acute complicated illness / injury requiring diagnostic workup.  Patient's diagnosis is consistent with ***. Patient will be discharged home with prescriptions for ***. Patient is to follow up with *** as needed or otherwise directed. Patient is given ED precautions to return to the ED for any worsening or new symptoms.     FINAL CLINICAL IMPRESSION(S) / ED DIAGNOSES  Final diagnoses:  None     Rx / DC Orders   ED Discharge Orders     None        Note:  This document was prepared using Dragon voice recognition software and may include unintentional dictation errors.

## 2022-07-30 NOTE — ED Triage Notes (Signed)
Patient to ED via POV for lower back pain that radiates into hips. Recently finished antibiotic for UTI. Denies urinary symptoms.

## 2022-08-04 ENCOUNTER — Other Ambulatory Visit: Payer: Self-pay

## 2022-08-04 ENCOUNTER — Emergency Department
Admission: EM | Admit: 2022-08-04 | Discharge: 2022-08-04 | Disposition: A | Discharge status: Home / Self Care | Payer: Medicaid Other | Attending: Emergency Medicine | Admitting: Emergency Medicine

## 2022-08-04 ENCOUNTER — Emergency Department: Payer: Medicaid Other

## 2022-08-04 DIAGNOSIS — R1084 Generalized abdominal pain: Secondary | ICD-10-CM | POA: Insufficient documentation

## 2022-08-04 DIAGNOSIS — D72829 Elevated white blood cell count, unspecified: Secondary | ICD-10-CM | POA: Insufficient documentation

## 2022-08-04 DIAGNOSIS — R109 Unspecified abdominal pain: Secondary | ICD-10-CM | POA: Diagnosis present

## 2022-08-04 LAB — URINALYSIS, ROUTINE W REFLEX MICROSCOPIC
Bacteria, UA: NONE SEEN
Bilirubin Urine: NEGATIVE
Glucose, UA: NEGATIVE mg/dL
Hgb urine dipstick: NEGATIVE
Ketones, ur: NEGATIVE mg/dL
Nitrite: NEGATIVE
Protein, ur: NEGATIVE mg/dL
Specific Gravity, Urine: 1.01 (ref 1.005–1.030)
pH: 7 (ref 5.0–8.0)

## 2022-08-04 LAB — CBC WITH DIFFERENTIAL/PLATELET
Abs Immature Granulocytes: 0.05 10*3/uL (ref 0.00–0.07)
Basophils Absolute: 0 10*3/uL (ref 0.0–0.1)
Basophils Relative: 0 %
Eosinophils Absolute: 0.1 10*3/uL (ref 0.0–0.5)
Eosinophils Relative: 0 %
HCT: 41.8 % (ref 36.0–46.0)
Hemoglobin: 14 g/dL (ref 12.0–15.0)
Immature Granulocytes: 0 %
Lymphocytes Relative: 15 %
Lymphs Abs: 2.3 10*3/uL (ref 0.7–4.0)
MCH: 30 pg (ref 26.0–34.0)
MCHC: 33.5 g/dL (ref 30.0–36.0)
MCV: 89.7 fL (ref 80.0–100.0)
Monocytes Absolute: 0.9 10*3/uL (ref 0.1–1.0)
Monocytes Relative: 6 %
Neutro Abs: 12.2 10*3/uL — ABNORMAL HIGH (ref 1.7–7.7)
Neutrophils Relative %: 79 %
Platelets: 295 10*3/uL (ref 150–400)
RBC: 4.66 MIL/uL (ref 3.87–5.11)
RDW: 11.7 % (ref 11.5–15.5)
WBC: 15.5 10*3/uL — ABNORMAL HIGH (ref 4.0–10.5)
nRBC: 0 % (ref 0.0–0.2)

## 2022-08-04 LAB — COMPREHENSIVE METABOLIC PANEL
ALT: 12 U/L (ref 0–44)
AST: 14 U/L — ABNORMAL LOW (ref 15–41)
Albumin: 4.2 g/dL (ref 3.5–5.0)
Alkaline Phosphatase: 54 U/L (ref 38–126)
Anion gap: 9 (ref 5–15)
BUN: 9 mg/dL (ref 6–20)
CO2: 21 mmol/L — ABNORMAL LOW (ref 22–32)
Calcium: 9 mg/dL (ref 8.9–10.3)
Chloride: 106 mmol/L (ref 98–111)
Creatinine, Ser: 0.49 mg/dL (ref 0.44–1.00)
GFR, Estimated: >60 mL/min (ref >60)
Glucose, Bld: 90 mg/dL (ref 70–99)
Potassium: 4 mmol/L (ref 3.5–5.1)
Sodium: 136 mmol/L (ref 135–145)
Total Bilirubin: 1 mg/dL (ref 0.3–1.2)
Total Protein: 7.2 g/dL (ref 6.5–8.1)

## 2022-08-04 LAB — WET PREP, GENITAL
Clue Cells Wet Prep HPF POC: NONE SEEN — AB
Sperm: NONE SEEN
Trich, Wet Prep: NONE SEEN — AB
WBC, Wet Prep HPF POC: <10 (ref <10)
Yeast Wet Prep HPF POC: NONE SEEN — AB

## 2022-08-04 LAB — LACTIC ACID, PLASMA
Lactic Acid, Venous: 1 mmol/L (ref 0.5–1.9)
Lactic Acid, Venous: 0.7 mmol/L (ref 0.5–1.9)

## 2022-08-04 LAB — POC URINE PREG, ED: Preg Test, Ur: NEGATIVE

## 2022-08-04 LAB — LIPASE, BLOOD: Lipase: 30 U/L (ref 11–51)

## 2022-08-04 MED ORDER — NAPROXEN 500 MG PO TABS
500.0000 mg | ORAL_TABLET | Freq: Once | ORAL | Status: AC
  Administered 2022-08-04: 500 mg via ORAL
  Filled 2022-08-04: qty 1

## 2022-08-04 MED ORDER — IOHEXOL 300 MG/ML  SOLN
80.0000 mL | Freq: Once | INTRAMUSCULAR | Status: AC | PRN
  Administered 2022-08-04: 80 mL via INTRAVENOUS

## 2022-08-04 MED ORDER — NAPROXEN 500 MG PO TABS: 500.0000 mg | ORAL_TABLET | Freq: Two times a day (BID) | ORAL | 0 refills | Status: DC

## 2022-08-04 NOTE — Discharge Instructions (Signed)
With your primary care provider if any continued problems or concerns.  A prescription for naproxen was sent to the pharmacy which is the same medicine that you are being given in the emergency department.  Lab work today along with your CT scan and ultrasound did not show any infection.  It does show a ovarian follicle (cyst).  The naproxen should help with this.  Also consider make an appointment with your gynecologist if any continued problems.

## 2022-08-04 NOTE — ED Provider Notes (Signed)
[facility] Provider Note    Event Date/Time   First MD Initiated Contact with Patient 08/04/22 1607     (approximate)   History   Abdominal Cramping   HPI  Crystal Melendez is a 22 y.o. female presents to the ED with complaint of abdominal pain that started this morning.  Patient states that is continued throughout the day and is worse on the right.  She has tried heating pad, ibuprofen and sleeping without any relief of her pain.  Patient denies any urinary symptoms, vomiting or diarrhea.  She is not aware of any fever and denies chills.  Some nausea reported.     Physical Exam   Triage Vital Signs: ED Triage Vitals  Enc Vitals Group     BP 08/04/22 1450 97/74     Pulse Rate 08/04/22 1450 (!) 107     Resp 08/04/22 1450 16     Temp 08/04/22 1450 98.4 F (36.9 C)     Temp Source 08/04/22 1450 Oral     SpO2 08/04/22 1450 100 %     Weight --      Height --      Head Circumference --      Peak Flow --      Pain Score 08/04/22 1452 10     Pain Loc --      Pain Edu? --      Excl. in GC? --     Most recent vital signs: Vitals:   08/04/22 1450 08/04/22 1919  BP: 97/74 112/67  Pulse: (!) 107 (!) 106  Resp: 16 16  Temp: 98.4 F (36.9 C)   SpO2: 100% 99%     General: Awake, no distress.  CV:  Good peripheral perfusion.  Heart regular rate and rhythm. Resp:  Normal effort.  Clear bilaterally. Abd:  No distention.  Soft, diffusely tender throughout however right lower quadrant more tender in comparison.  Bowel sounds are present x 4 quadrants. Other:     ED Results / Procedures / Treatments   Labs (all labs ordered are listed, but only abnormal results are displayed) Labs Reviewed  WET PREP, GENITAL - Abnormal; Notable for the following components:      Result Value   Yeast Wet Prep HPF POC NONE SEEN (*)    Trich, Wet Prep NONE SEEN (*)    Clue Cells Wet Prep HPF POC NONE SEEN (*)    All other components within normal limits   COMPREHENSIVE METABOLIC PANEL - Abnormal; Notable for the following components:   CO2 21 (*)    AST 14 (*)    All other components within normal limits  CBC WITH DIFFERENTIAL/PLATELET - Abnormal; Notable for the following components:   WBC 15.5 (*)    Neutro Abs 12.2 (*)    All other components within normal limits  URINALYSIS, ROUTINE W REFLEX MICROSCOPIC - Abnormal; Notable for the following components:   Color, Urine YELLOW (*)    APPearance CLEAR (*)    Leukocytes,Ua TRACE (*)    All other components within normal limits  CULTURE, BLOOD (ROUTINE X 2)  CULTURE, BLOOD (ROUTINE X 2)  LIPASE, BLOOD  LACTIC ACID, PLASMA  LACTIC ACID, PLASMA  POC URINE PREG, ED      RADIOLOGY CT abdomen and pelvis per radiologist is negative for acute abdominal pelvis process.    PROCEDURES:  Critical Care performed:   Procedures   MEDICATIONS ORDERED IN ED: Medications  iohexol (OMNIPAQUE) 300 MG/ML  solution 80 mL (80 mLs Intravenous Contrast Given 08/04/22 1644)  naproxen (NAPROSYN) tablet 500 mg (500 mg Oral Given 08/04/22 1932)     IMPRESSION / MDM / ASSESSMENT AND PLAN / ED COURSE  I reviewed the triage vital signs and the nursing notes.   Differential diagnosis includes, but is not limited to, urinary tract infection, appendicitis, urolithiasis, viral illness, vaginitis, ovarian cyst.  22 year old female presents to the ED with complaint of abdominal pain that started this morning without known fever, chills, vaginal discharge, urinary symptoms.  Urine pregnancy test is negative, wet prep negative, lipase 30, lactic acid 0.7, CMP unremarkable, WBC was elevated at 15.5.  CT was essentially negative with the exception of a left ovarian follicle.  Pelvic ultrasound with transvaginal also showed the left ovarian follicle.  No abnormal free fluid.  Patient was given a naproxen while in the emergency department and a prescription for the same was sent to the pharmacy.  She is instructed to  return to the emergency department if any severe worsening of her symptoms or urgent concerns.  She plans to follow-up with Dr. Jean Rosenthal who is her OB/GYN.     Patient's presentation is most consistent with acute illness / injury with system symptoms.  FINAL CLINICAL IMPRESSION(S) / ED DIAGNOSES   Final diagnoses:  Abdominal pain, acute, generalized     Rx / DC Orders   ED Discharge Orders          Ordered    naproxen (NAPROSYN) 500 MG tablet  2 times daily with meals        08/04/22 1929             Note:  This document was prepared using Dragon voice recognition software and may include unintentional dictation errors.   Tommi Rumps, PA-C 08/04/22 Terrial Rhodes, MD 08/19/22 [phone]

## 2022-08-04 NOTE — ED Triage Notes (Addendum)
Pt reports lower back pain and lower abdominal/pelvic pain since this morning that is worse on the right side. Pt reports nausea, denies V/D. Pt has tried heating pads, ibuprofen, and repositioning w/ out relief. Pt was recently treated for URI. Pt has appendix and gallbladder. Pt denies bleeding. Pt alert, tearful in triage.

## 2022-08-07 LAB — CULTURE, BLOOD (ROUTINE X 2)

## 2022-08-09 LAB — CULTURE, BLOOD (ROUTINE X 2)
Culture: NO GROWTH
Special Requests: ADEQUATE

## 2022-09-16 ENCOUNTER — Other Ambulatory Visit: Payer: Self-pay

## 2022-09-16 DIAGNOSIS — Z3A00 Weeks of gestation of pregnancy not specified: Secondary | ICD-10-CM | POA: Insufficient documentation

## 2022-09-16 DIAGNOSIS — R109 Unspecified abdominal pain: Secondary | ICD-10-CM | POA: Diagnosis not present

## 2022-09-16 DIAGNOSIS — O26899 Other specified pregnancy related conditions, unspecified trimester: Secondary | ICD-10-CM | POA: Diagnosis present

## 2022-09-16 DIAGNOSIS — Z5321 Procedure and treatment not carried out due to patient leaving prior to being seen by health care provider: Secondary | ICD-10-CM | POA: Diagnosis not present

## 2022-09-16 DIAGNOSIS — R197 Diarrhea, unspecified: Secondary | ICD-10-CM | POA: Insufficient documentation

## 2022-09-16 LAB — COMPREHENSIVE METABOLIC PANEL
ALT: 20 U/L (ref 0–44)
AST: 18 U/L (ref 15–41)
Albumin: 4.4 g/dL (ref 3.5–5.0)
Alkaline Phosphatase: 57 U/L (ref 38–126)
Anion gap: 10 (ref 5–15)
BUN: 11 mg/dL (ref 6–20)
CO2: 21 mmol/L — ABNORMAL LOW (ref 22–32)
Calcium: 9.3 mg/dL (ref 8.9–10.3)
Chloride: 105 mmol/L (ref 98–111)
Creatinine, Ser: 0.56 mg/dL (ref 0.44–1.00)
GFR, Estimated: >60 mL/min (ref >60)
Glucose, Bld: 92 mg/dL (ref 70–99)
Potassium: 3.8 mmol/L (ref 3.5–5.1)
Sodium: 136 mmol/L (ref 135–145)
Total Bilirubin: 0.7 mg/dL (ref 0.3–1.2)
Total Protein: 7.8 g/dL (ref 6.5–8.1)

## 2022-09-16 LAB — URINALYSIS, ROUTINE W REFLEX MICROSCOPIC
Bilirubin Urine: NEGATIVE
Glucose, UA: NEGATIVE mg/dL
Hgb urine dipstick: NEGATIVE
Ketones, ur: NEGATIVE mg/dL
Leukocytes,Ua: NEGATIVE
Nitrite: NEGATIVE
Protein, ur: NEGATIVE mg/dL
Specific Gravity, Urine: 1.009 (ref 1.005–1.030)
pH: 6 (ref 5.0–8.0)

## 2022-09-16 LAB — CBC WITH DIFFERENTIAL/PLATELET
Abs Immature Granulocytes: 0.02 10*3/uL (ref 0.00–0.07)
Basophils Absolute: 0.1 10*3/uL (ref 0.0–0.1)
Basophils Relative: 1 %
Eosinophils Absolute: 0.1 10*3/uL (ref 0.0–0.5)
Eosinophils Relative: 1 %
HCT: 39 % (ref 36.0–46.0)
Hemoglobin: 13.5 g/dL (ref 12.0–15.0)
Immature Granulocytes: 0 %
Lymphocytes Relative: 37 %
Lymphs Abs: 3.4 10*3/uL (ref 0.7–4.0)
MCH: 30.1 pg (ref 26.0–34.0)
MCHC: 34.6 g/dL (ref 30.0–36.0)
MCV: 87.1 fL (ref 80.0–100.0)
Monocytes Absolute: 0.8 10*3/uL (ref 0.1–1.0)
Monocytes Relative: 8 %
Neutro Abs: 4.8 10*3/uL (ref 1.7–7.7)
Neutrophils Relative %: 53 %
Platelets: 307 10*3/uL (ref 150–400)
RBC: 4.48 MIL/uL (ref 3.87–5.11)
RDW: 12.1 % (ref 11.5–15.5)
WBC: 9.1 10*3/uL (ref 4.0–10.5)
nRBC: 0 % (ref 0.0–0.2)

## 2022-09-16 LAB — HCG, QUANTITATIVE, PREGNANCY: hCG, Beta Chain, Quant, S: 376 m[IU]/mL — ABNORMAL HIGH (ref <5)

## 2022-09-16 LAB — POC URINE PREG, ED: Preg Test, Ur: POSITIVE — AB

## 2022-09-16 LAB — LIPASE, BLOOD: Lipase: 37 U/L (ref 11–51)

## 2022-09-16 NOTE — ED Triage Notes (Signed)
Pt states she had a positive pregnancy test on Thursday, and developed abd cramping and diarrhea earlier today. Denies vaginal bleeding.

## 2022-09-17 ENCOUNTER — Emergency Department: Payer: Medicaid Other

## 2022-09-17 ENCOUNTER — Emergency Department
Admission: EM | Admit: 2022-09-17 | Discharge: 2022-09-17 | Discharge status: Against Medical Advice | Payer: Medicaid Other | Attending: Emergency Medicine | Admitting: Emergency Medicine

## 2022-10-23 LAB — OB RESULTS CONSOLE RPR: RPR: NONREACTIVE

## 2022-10-23 LAB — OB RESULTS CONSOLE GC/CHLAMYDIA
Chlamydia: NEGATIVE
Neisseria Gonorrhea: NEGATIVE

## 2022-10-23 LAB — OB RESULTS CONSOLE RUBELLA ANTIBODY, IGM: Rubella: NON-IMMUNE/NOT IMMUNE

## 2022-10-23 LAB — OB RESULTS CONSOLE VARICELLA ZOSTER ANTIBODY, IGG: Varicella: NON-IMMUNE/NOT IMMUNE

## 2022-10-23 LAB — OB RESULTS CONSOLE HEPATITIS B SURFACE ANTIGEN: Hepatitis B Surface Ag: NEGATIVE

## 2022-10-23 LAB — OB RESULTS CONSOLE HIV ANTIBODY (ROUTINE TESTING): HIV: NONREACTIVE

## 2022-12-24 ENCOUNTER — Ambulatory Visit
Admission: EM | Admit: 2022-12-24 | Discharge: 2022-12-24 | Disposition: A | Discharge status: Home / Self Care | Payer: Medicaid Other | Attending: Family Medicine | Admitting: Family Medicine

## 2022-12-24 DIAGNOSIS — J029 Acute pharyngitis, unspecified: Secondary | ICD-10-CM | POA: Diagnosis not present

## 2022-12-24 DIAGNOSIS — J069 Acute upper respiratory infection, unspecified: Secondary | ICD-10-CM | POA: Diagnosis not present

## 2022-12-24 LAB — POCT RAPID STREP A (OFFICE): Rapid Strep A Screen: NEGATIVE

## 2022-12-24 MED ORDER — AMOXICILLIN 500 MG PO CAPS: 500.0000 mg | ORAL_CAPSULE | Freq: Two times a day (BID) | ORAL | 0 refills | Status: AC

## 2022-12-24 NOTE — ED Triage Notes (Signed)
Patient to Urgent Care with complaints of sore throat/ nasal congestion. Reports diarrhea two days ago. Denies any known fevers.   Reports symptoms started four days ago. Reports symptoms are worse today. Difficulty to eat/ talk. Eating soups/ drinking hot tea.   Pregnant, [redacted]w[redacted]d EDD 4/29.

## 2022-12-24 NOTE — ED Provider Notes (Signed)
[name]    CSN: [id number] Arrival date & time: 12/24/22  1731      History   Chief Complaint Chief Complaint  Patient presents with   Sore Throat    HPI Crystal Melendez is a 22 y.o. female.  Patient presents today for evaluation of sore throat which started 4 days ago and the last 2 days developed diarrhea and nasal congestion.  Patient is [redacted] weeks pregnant and has been managing symptoms with natural remedies.  Patient reports she is having difficulty speaking and swallowing due to the swelling and discomfort in her throat.  Denies any history of recurrent strep infection.  Past Medical History:  Diagnosis Date   ADHD    states when little; on no medication   Bipolar 1 disorder (HCC)    states dx'ed years ago; no medication   Generalized anxiety disorder 10/06/2021    Patient Active Problem List   Diagnosis Date Noted   Generalized anxiety disorder 10/06/2021   Family history of hypercholesterolemia 09/11/2013    Past Surgical History:  Procedure Laterality Date   DENTAL SURGERY     all four age 54    OB History     Gravida  4   Para  3   Term  3   Preterm  0   AB  0   Living  3      SAB  0   IAB  0   Ectopic  0   Multiple  0   Live Births  3            Home Medications    Prior to Admission medications   Medication Sig Start Date End Date Taking? Authorizing Provider  amoxicillin (AMOXIL) 500 MG capsule Take 1 capsule (500 mg total) by mouth 2 (two) times daily for 10 days. 12/24/22 01/03/23 Yes Bing Neighbors, NP  sertraline (ZOLOFT) 25 MG tablet Take 1 tablet by mouth daily. 12/11/22 12/11/23 Yes [provider]  Cranberry (RA CRANBERRY) 500 MG CAPS Take by mouth.    [provider]  Ferrous Sulfate (IRON PO) Take 1 tablet by mouth daily.    [provider]  fluticasone (FLONASE) 50 MCG/ACT nasal spray Place into both nostrils. Patient not taking: Reported on 12/24/2022    [provider]  naproxen (NAPROSYN) 500 MG tablet Take 1 tablet (500 mg total) by mouth 2 (two) times daily with a meal. Patient not taking: Reported on 12/24/2022 08/04/22   Tommi Rumps, PA-C  omeprazole (PRILOSEC) 20 MG capsule Take 20 mg by mouth daily. Patient not taking: Reported on 12/24/2022 06/07/22   [provider]    Family History Family History  Problem Relation Age of Onset   Diabetes Maternal Grandmother    Bipolar disorder Maternal Grandmother     Social History Social History   Tobacco Use   Smoking status: Never   Smokeless tobacco: Never  Vaping Use   Vaping status: Former   Devices: last use one month ago  Substance Use Topics   Alcohol use: Not Currently    Comment: last use 4 years ago   Drug use: Not Currently    Frequency: 3.0 times per week    Types: Marijuana    Comment: last use 4 years ago     Allergies   Sulfa antibiotics   Review of Systems Review of Systems Pertinent negatives listed in HPI   Physical Exam Triage Vital Signs ED Triage Vitals  Encounter Vitals Group     BP 12/24/22 1757 118/74     Systolic BP Percentile --      Diastolic BP Percentile --      Pulse Rate 12/24/22 1757 85     Resp 12/24/22 1757 18     Temp 12/24/22 1757 99 F (37.2 C)     Temp src --      SpO2 12/24/22 1757 96 %     Weight --      Height --      Head Circumference --      Peak Flow --      Pain Score 12/24/22 1806 8     Pain Loc --      Pain Education --      Exclude from Growth Chart --    No data found.  Updated Vital Signs BP 118/74   Pulse 85   Temp 99 F (37.2 C)   Resp 18   LMP 08/14/2022   SpO2 96%   Visual Acuity Right Eye Distance:   Left Eye Distance:   Bilateral Distance:    Right Eye Near:   Left Eye Near:    Bilateral Near:     Physical Exam Vitals reviewed.  Constitutional:      Appearance: She is ill-appearing.  HENT:     Head: Normocephalic.     Right Ear: Tympanic membrane and ear canal  normal.     Left Ear: Tympanic membrane and ear canal normal.     Nose: Congestion and rhinorrhea present.     Mouth/Throat:     Pharynx: Pharyngeal swelling, oropharyngeal exudate and posterior oropharyngeal erythema present.     Tonsils: 3+ on the right. 3+ on the left.  Cardiovascular:     Rate and Rhythm: Normal rate and regular rhythm.  Pulmonary:     Effort: Pulmonary effort is normal.     Breath sounds: Normal breath sounds.  Lymphadenopathy:     Cervical: Cervical adenopathy present.  Skin:    General: Skin is warm and dry.  Neurological:     General: No focal deficit present.     Mental Status: She is alert.      UC Treatments / Results  Labs (all labs ordered are listed, but only abnormal results are displayed) Labs Reviewed  POCT RAPID STREP A (OFFICE)    EKG   Radiology No results found.  Procedures Procedures (including critical care time)  Medications Ordered in UC Medications - No data to display  Initial Impression / Assessment and Plan / UC Course  I have reviewed the triage vital signs and the nursing notes.  Pertinent labs & imaging results that were available during my care of the patient were reviewed by me and considered in my medical decision making (see chart for details).    Acute pharyngitis, rapid strep negative, treating clinically based on evaluation symptoms appear concerning for strep.  Treat empirically with amoxicillin 500 mg twice daily for 10 days.  Given patient is [redacted] weeks pregnant recommended Benadryl for cough and nasal symptoms.  Hydrate well with fluids.  Strict return precautions given if symptoms worsen or do not improve.  Final Clinical Impressions(s) / UC Diagnoses   Final diagnoses:  Acute pharyngitis, unspecified etiology  Viral URI     Discharge Instructions      You may take an Benadryl over-the-counter which is good for cough and nasal symptoms.  Based on evaluation I am covering you for possible  streptococcal  throat infection.  Start amoxicillin 500 mg twice daily for 10 days.  You may gargle with salt water to help with throat pain or take Tylenol.  Return if if symptoms do not resolve with prescribed treatment.     ED Prescriptions     Medication Sig Dispense Auth. Provider   amoxicillin (AMOXIL) 500 MG capsule Take 1 capsule (500 mg total) by mouth 2 (two) times daily for 10 days. 20 capsule Bing Neighbors, NP      PDMP not reviewed this encounter.   Bing Neighbors, NP 12/24/22 [phone]

## 2022-12-24 NOTE — Discharge Instructions (Signed)
You may take an Benadryl over-the-counter which is good for cough and nasal symptoms.  Based on evaluation I am covering you for possible streptococcal throat infection.  Start amoxicillin 500 mg twice daily for 10 days.  You may gargle with salt water to help with throat pain or take Tylenol.  Return if if symptoms do not resolve with prescribed treatment.

## 2023-01-30 NOTE — L&D Delivery Note (Signed)
 Delivery Note  First Stage: Labor onset: 1800 Augmentation : AROM Analgesia /Anesthesia intrapartum: epidural AROM at 0103  Second Stage: Complete dilation at 0506 Onset of pushing at 0516 FHR second stage n/a, delivery  Delivery of a viable female infant 05/07/2023 at 0518 by Donato Schultz, CNM. delivery of fetal head in OA position with restitution to ROA. No nuchal cord;  Anterior then posterior shoulders delivered easily with gentle downward traction. Baby placed on mom's chest, and attended to by peds.  Cord double clamped after cessation of pulsation, cut by FOB and sister Cord blood sample collected   Third Stage: Placenta delivered Tomasa Blase intact with 3 VC @ [phone] Placenta disposition: they are taking home to bury under a plant Uterine tone firm / bleeding scant  No laceration identified  Anesthesia for repair: none needed Repair none Est. Blood Loss (mL):  Complications: none  Mom to postpartum.  Baby to Couplet care / Skin to Skin.  Newborn: Birth Weight: 2760g  Apgar Scores: 7, 9 Feeding planned: breastfeeding

## 2023-03-28 ENCOUNTER — Other Ambulatory Visit: Payer: Self-pay

## 2023-03-28 ENCOUNTER — Observation Stay
Admission: EM | Admit: 2023-03-28 | Discharge: 2023-03-28 | Disposition: A | Discharge status: Home / Self Care | Payer: Medicaid Other | Attending: Obstetrics and Gynecology | Admitting: Obstetrics and Gynecology

## 2023-03-28 ENCOUNTER — Encounter: Payer: Self-pay | Admitting: Obstetrics and Gynecology

## 2023-03-28 DIAGNOSIS — Z3A31 31 weeks gestation of pregnancy: Secondary | ICD-10-CM | POA: Insufficient documentation

## 2023-03-28 DIAGNOSIS — O26899 Other specified pregnancy related conditions, unspecified trimester: Principal | ICD-10-CM | POA: Diagnosis present

## 2023-03-28 DIAGNOSIS — Z79899 Other long term (current) drug therapy: Secondary | ICD-10-CM | POA: Diagnosis not present

## 2023-03-28 DIAGNOSIS — O26893 Other specified pregnancy related conditions, third trimester: Principal | ICD-10-CM | POA: Insufficient documentation

## 2023-03-28 DIAGNOSIS — R102 Pelvic and perineal pain: Secondary | ICD-10-CM | POA: Diagnosis not present

## 2023-03-28 LAB — URINALYSIS, COMPLETE (UACMP) WITH MICROSCOPIC
Bilirubin Urine: NEGATIVE
Glucose, UA: NEGATIVE mg/dL
Hgb urine dipstick: NEGATIVE
Ketones, ur: NEGATIVE mg/dL
Nitrite: NEGATIVE
Protein, ur: NEGATIVE mg/dL
Specific Gravity, Urine: 1.013 (ref 1.005–1.030)
WBC, UA: >50 WBC/hpf (ref 0–5)
pH: 7 (ref 5.0–8.0)

## 2023-03-28 LAB — WET PREP, GENITAL
Clue Cells Wet Prep HPF POC: NONE SEEN
Sperm: NONE SEEN
Trich, Wet Prep: NONE SEEN
WBC, Wet Prep HPF POC: <10 (ref <10)
Yeast Wet Prep HPF POC: NONE SEEN

## 2023-03-28 MED ORDER — ZOLPIDEM TARTRATE 5 MG PO TABS: 5.0000 mg | ORAL_TABLET | Freq: Every evening | ORAL | Status: DC | PRN

## 2023-03-28 MED ORDER — PRENATAL MULTIVITAMIN CH: 1.0000 | ORAL_TABLET | Freq: Every day | ORAL | Status: DC

## 2023-03-28 MED ORDER — ACETAMINOPHEN 325 MG PO TABS
650.0000 mg | ORAL_TABLET | ORAL | Status: DC | PRN
Start: 2023-03-28 — End: 2023-03-28

## 2023-03-28 MED ORDER — CALCIUM CARBONATE ANTACID 500 MG PO CHEW: 2.0000 | CHEWABLE_TABLET | ORAL | Status: DC | PRN

## 2023-03-28 MED ORDER — DOCUSATE SODIUM 100 MG PO CAPS: 100.0000 mg | ORAL_CAPSULE | Freq: Every day | ORAL | Status: DC

## 2023-03-28 NOTE — Progress Notes (Signed)
   03/28/23 0809  Fetal Heart Rate A  Mode External  Baseline Rate (A) 130 bpm  Variability 6-25 BPM  Accelerations 15 x 15  Decelerations None  Uterine Activity  Mode Toco  Contraction Frequency (min) mild UI   Reactive NST

## 2023-03-28 NOTE — Discharge Summary (Signed)
 Crystal Melendez is a 23 y.o. female. She is at [redacted]w[redacted]d gestation. Patient's last menstrual period was 08/14/2022. 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 The pain wraps around into her back. She works on her feet and it feels worse. Denies contractions. Denies leaking fluid. Endorses good fetal movement.   S: Resting comfortably. no CTX, no VB.no LOF,  Active fetal movement.  Denies: HA, visual changes, SOB, or RUQ/epigastric pain  Maternal Medical History:   Past Medical History:  Diagnosis Date   ADHD    states when little; on no medication   Bipolar 1 disorder (HCC)    states dx'ed years ago; no medication   Generalized anxiety disorder 10/06/2021    Past Surgical History:  Procedure Laterality Date   DENTAL SURGERY     all four age 47    Allergies  Allergen Reactions   Sulfa Antibiotics Rash    Prior to Admission medications   Medication Sig Start Date End Date Taking? Authorizing Provider  Prenatal Vit-Fe Fumarate-FA (PRENATAL MULTIVITAMIN) TABS tablet Take 1 tablet by mouth daily at 12 noon.   Yes [provider]  Cranberry (RA CRANBERRY) 500 MG CAPS Take by mouth. Patient not taking: Reported on 03/28/2023    [provider]  Ferrous Sulfate (IRON PO) Take 1 tablet by mouth daily. Patient not taking: Reported on 03/28/2023    [provider]  fluticasone (FLONASE) 50 MCG/ACT nasal spray Place into both nostrils. Patient not taking: Reported on 12/24/2022    [provider]  naproxen (NAPROSYN) 500 MG tablet Take 1 tablet (500 mg total) by mouth 2 (two) times daily with a meal. Patient not taking: Reported on 12/24/2022 08/04/22   Tommi Rumps, PA-C  omeprazole  (PRILOSEC) 20 MG capsule Take 20 mg by mouth daily. Patient not taking: Reported on 12/24/2022 06/07/22   [provider]  sertraline (ZOLOFT) 25 MG tablet Take 1 tablet by mouth daily. 12/11/22 12/11/23  [provider]    Social History: She  reports that she has never smoked. She has never used smokeless tobacco. She reports that she does not currently use alcohol. She reports that she does not currently use drugs after having used the following drugs: Marijuana. Frequency: 3.00 times per week.  Family History: family history includes Bipolar disorder in her maternal grandmother; Diabetes in her maternal grandmother.  no history of gyn cancers  Review of Systems: A full review of systems was performed and negative except as noted in the HPI.     O:  BP (!) 99/59 (BP Location: Right Arm)   Pulse (!) 105   Temp 98.3 F (36.8 C) (Oral)   Resp 16   Ht 5\' 1"  (1.549 m)   Wt 74.4 kg   LMP 08/14/2022   BMI 30.99 kg/m  Results for orders placed or performed during the hospital encounter of 03/28/23 (from the past 48 hours)  Wet prep, genital   Collection Time: 03/28/23  8:12 AM  Result Value Ref Range   Yeast Wet Prep HPF POC NONE SEEN NONE SEEN   Trich, Wet Prep NONE SEEN NONE SEEN   Clue Cells Wet Prep HPF POC NONE SEEN NONE SEEN   WBC, Wet Prep HPF POC <10 <10  Sperm NONE SEEN   Urinalysis, Complete w Microscopic -Urine, Clean Catch   Collection Time: 03/28/23  8:12 AM  Result Value Ref Range   Color, Urine YELLOW (A) YELLOW   APPearance HAZY (A) CLEAR   Specific Gravity, Urine 1.013 1.005 - 1.030   pH 7.0 5.0 - 8.0   Glucose, UA NEGATIVE NEGATIVE mg/dL   Hgb urine dipstick NEGATIVE NEGATIVE   Bilirubin Urine NEGATIVE NEGATIVE   Ketones, ur NEGATIVE NEGATIVE mg/dL   Protein, ur NEGATIVE NEGATIVE mg/dL   Nitrite NEGATIVE NEGATIVE   Leukocytes,Ua MODERATE (A) NEGATIVE   RBC / HPF 6-10 0 - 5 RBC/hpf   WBC, UA >50 0 - 5 WBC/hpf   Bacteria, UA RARE (A) NONE  SEEN   Squamous Epithelial / HPF 0-5 0 - 5 /HPF   Mucus PRESENT      Constitutional: NAD, AAOx3  HE/ENT: extraocular movements grossly intact, moist mucous membranes CV: RRR PULM: nl respiratory effort, CTABL     Abd: gravid, non-tender, non-distended, soft      Ext: Non-tender, Nonedematous   Psych: mood appropriate, speech normal Pelvic: SSE done, cervix closed and thick  Pelvic exam: normal external genitalia, vulva, vagina, cervix, uterus and adnexa.  Fetal  monitoring: Cat 1 Appropriate for GA Baseline: 135bpm Variability: moderate Accelerations: present x >2 Decelerations absent Time Uterine contractions: occasional uterine irritability  A/P: 22 y.o. [redacted]w[redacted]d here for antenatal surveillance for abdominal and pelvic pain  Principle Diagnosis:  Pelvic pain in pregnancy  Pelvic pain: pain improved after resting for 1hr. No contractions, wet prep and UA WNL, cervix closed, abdomen soft/nontender. Wear pregnancy support belt when on feet for prolonged period of time. Fetal Wellbeing: Reassuring Cat 1 tracing. Reactive NST  D/c home stable, precautions reviewed, follow-up as scheduled.    Crystal Melendez, CNM 03/28/2023 10:00 AM

## 2023-03-28 NOTE — OB Triage Note (Signed)
Pt discharged home per order.   Pt stable and ambulatory and an After Visit Summary was printed and given to the patient. Discharge education completed with patient/family including follow up instructions, appointments, and medication list. Pt received labor and bleeding precautions. Patient able to verbalize understanding, all questions fully answered upon discharge.   Pt discharged home via personal vehicle with support person.  

## 2023-03-28 NOTE — OB Triage Note (Signed)
 Pt reports to labor and delivery with complaints of pelvic pain and low back pain that started yesterday. 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 States that a heat and rest make it feel better. She took tylenol yesterday with minimal relief. Pelvic pain 5/10; back pain 5/10; abdominal pain around umbilicus 3/10. Abdominal pain comes and goes.   Denies vaginal bleeding, LOF, and contractions. States positive fetal movement.   EFM and toco applied and assessing.

## 2023-05-01 LAB — OB RESULTS CONSOLE GBS: GBS: NEGATIVE

## 2023-05-06 ENCOUNTER — Inpatient Hospital Stay
Admission: EM | Admit: 2023-05-06 | Discharge: 2023-05-08 | DRG: 807 | Disposition: A | Discharge status: Home / Self Care

## 2023-05-06 ENCOUNTER — Inpatient Hospital Stay: Admitting: Anesthesiology

## 2023-05-06 ENCOUNTER — Other Ambulatory Visit: Payer: Self-pay

## 2023-05-06 ENCOUNTER — Encounter: Payer: Self-pay | Admitting: Obstetrics and Gynecology

## 2023-05-06 DIAGNOSIS — O99344 Other mental disorders complicating childbirth: Secondary | ICD-10-CM | POA: Diagnosis present

## 2023-05-06 DIAGNOSIS — Z3A36 36 weeks gestation of pregnancy: Secondary | ICD-10-CM

## 2023-05-06 DIAGNOSIS — O9902 Anemia complicating childbirth: Secondary | ICD-10-CM | POA: Diagnosis present

## 2023-05-06 DIAGNOSIS — O4700 False labor before 37 completed weeks of gestation, unspecified trimester: Principal | ICD-10-CM | POA: Diagnosis present

## 2023-05-06 DIAGNOSIS — F411 Generalized anxiety disorder: Secondary | ICD-10-CM | POA: Diagnosis present

## 2023-05-06 DIAGNOSIS — Z2839 Other underimmunization status: Secondary | ICD-10-CM

## 2023-05-06 DIAGNOSIS — Z79899 Other long term (current) drug therapy: Secondary | ICD-10-CM | POA: Diagnosis not present

## 2023-05-06 DIAGNOSIS — F32A Depression, unspecified: Secondary | ICD-10-CM

## 2023-05-06 DIAGNOSIS — D573 Sickle-cell trait: Secondary | ICD-10-CM

## 2023-05-06 DIAGNOSIS — O479 False labor, unspecified: Secondary | ICD-10-CM | POA: Diagnosis present

## 2023-05-06 DIAGNOSIS — O99892 Other specified diseases and conditions complicating childbirth: Secondary | ICD-10-CM

## 2023-05-06 DIAGNOSIS — O09899 Supervision of other high risk pregnancies, unspecified trimester: Secondary | ICD-10-CM

## 2023-05-06 LAB — TYPE AND SCREEN
ABO/RH(D): O POS
Antibody Screen: NEGATIVE

## 2023-05-06 LAB — CBC
HCT: 36.1 % (ref 36.0–46.0)
Hemoglobin: 12.2 g/dL (ref 12.0–15.0)
MCH: 29.5 pg (ref 26.0–34.0)
MCHC: 33.8 g/dL (ref 30.0–36.0)
MCV: 87.4 fL (ref 80.0–100.0)
Platelets: 279 10*3/uL (ref 150–400)
RBC: 4.13 MIL/uL (ref 3.87–5.11)
RDW: 13 % (ref 11.5–15.5)
WBC: 12.8 10*3/uL — ABNORMAL HIGH (ref 4.0–10.5)
nRBC: 0 % (ref 0.0–0.2)

## 2023-05-06 LAB — RUPTURE OF MEMBRANE (ROM)PLUS: Rom Plus: NEGATIVE

## 2023-05-06 MED ORDER — OXYTOCIN BOLUS FROM INFUSION
333.0000 mL | Freq: Once | INTRAVENOUS | Status: AC
  Administered 2023-05-07: 333 mL via INTRAVENOUS

## 2023-05-06 MED ORDER — CALCIUM CARBONATE ANTACID 500 MG PO CHEW: 2.0000 | CHEWABLE_TABLET | ORAL | Status: DC | PRN

## 2023-05-06 MED ORDER — DIPHENHYDRAMINE HCL 50 MG/ML IJ SOLN: 12.5000 mg | INTRAMUSCULAR | Status: DC | PRN

## 2023-05-06 MED ORDER — LIDOCAINE HCL (PF) 1 % IJ SOLN
INTRAMUSCULAR | Status: AC
  Filled 2023-05-06: qty 30

## 2023-05-06 MED ORDER — MISOPROSTOL 200 MCG PO TABS
ORAL_TABLET | ORAL | Status: AC
  Filled 2023-05-06: qty 4

## 2023-05-06 MED ORDER — FENTANYL CITRATE (PF) 100 MCG/2ML IJ SOLN
50.0000 ug | INTRAMUSCULAR | Status: DC | PRN
Start: 2023-05-06 — End: 2023-05-07

## 2023-05-06 MED ORDER — LIDOCAINE HCL (PF) 1 % IJ SOLN
INTRAMUSCULAR | Status: DC | PRN
  Administered 2023-05-06: 3 mL via SUBCUTANEOUS

## 2023-05-06 MED ORDER — PHENYLEPHRINE 80 MCG/ML (10ML) SYRINGE FOR IV PUSH (FOR BLOOD PRESSURE SUPPORT): 80.0000 ug | PREFILLED_SYRINGE | INTRAVENOUS | Status: DC | PRN

## 2023-05-06 MED ORDER — LACTATED RINGERS IV SOLN: 500.0000 mL | INTRAVENOUS | Status: DC | PRN

## 2023-05-06 MED ORDER — SOD CITRATE-CITRIC ACID 500-334 MG/5ML PO SOLN: 30.0000 mL | ORAL | Status: DC | PRN

## 2023-05-06 MED ORDER — LIDOCAINE-EPINEPHRINE (PF) 1.5 %-1:200000 IJ SOLN
INTRAMUSCULAR | Status: DC | PRN
Start: 2023-05-06 — End: 2023-05-07
  Administered 2023-05-06: 3 mL via EPIDURAL

## 2023-05-06 MED ORDER — FENTANYL-BUPIVACAINE-NACL 0.5-0.125-0.9 MG/250ML-% EP SOLN
EPIDURAL | Status: AC
  Filled 2023-05-06: qty 250

## 2023-05-06 MED ORDER — AMMONIA AROMATIC IN INHA
RESPIRATORY_TRACT | Status: AC
  Filled 2023-05-06: qty 10

## 2023-05-06 MED ORDER — LACTATED RINGERS IV SOLN: 500.0000 mL | Freq: Once | INTRAVENOUS | Status: DC

## 2023-05-06 MED ORDER — SODIUM CHLORIDE 0.9 % IV SOLN
INTRAVENOUS | Status: DC | PRN
  Administered 2023-05-06: 5 mL via EPIDURAL
  Administered 2023-05-06: 3 mL via EPIDURAL

## 2023-05-06 MED ORDER — LIDOCAINE HCL (PF) 1 % IJ SOLN: 30.0000 mL | INTRAMUSCULAR | Status: DC | PRN

## 2023-05-06 MED ORDER — ONDANSETRON HCL 4 MG/2ML IJ SOLN
4.0000 mg | Freq: Four times a day (QID) | INTRAMUSCULAR | Status: DC | PRN
  Administered 2023-05-06: 4 mg via INTRAVENOUS
  Filled 2023-05-06: qty 2

## 2023-05-06 MED ORDER — EPHEDRINE 5 MG/ML INJ
10.0000 mg | INTRAVENOUS | Status: AC | PRN
  Administered 2023-05-06 (×2): 10 mg via INTRAVENOUS
  Filled 2023-05-06: qty 5

## 2023-05-06 MED ORDER — OXYTOCIN 10 UNIT/ML IJ SOLN
INTRAMUSCULAR | Status: AC
  Filled 2023-05-06: qty 2

## 2023-05-06 MED ORDER — ACETAMINOPHEN 325 MG PO TABS: 650.0000 mg | ORAL_TABLET | ORAL | Status: DC | PRN

## 2023-05-06 MED ORDER — OXYTOCIN-SODIUM CHLORIDE 30-0.9 UT/500ML-% IV SOLN
INTRAVENOUS | Status: AC
  Filled 2023-05-06: qty 500

## 2023-05-06 MED ORDER — EPHEDRINE 5 MG/ML INJ
10.0000 mg | INTRAVENOUS | Status: DC | PRN
  Administered 2023-05-07: 10 mg via INTRAVENOUS

## 2023-05-06 MED ORDER — LACTATED RINGERS IV SOLN: INTRAVENOUS | Status: DC

## 2023-05-06 MED ORDER — FENTANYL-BUPIVACAINE-NACL 0.5-0.125-0.9 MG/250ML-% EP SOLN
12.0000 mL/h | EPIDURAL | Status: DC | PRN
  Administered 2023-05-06: 12 mL/h via EPIDURAL

## 2023-05-06 MED ORDER — ACETAMINOPHEN 325 MG PO TABS
650.0000 mg | ORAL_TABLET | ORAL | Status: DC | PRN
Start: 2023-05-06 — End: 2023-05-07

## 2023-05-06 MED ORDER — OXYTOCIN-SODIUM CHLORIDE 30-0.9 UT/500ML-% IV SOLN
2.5000 [IU]/h | INTRAVENOUS | Status: DC
  Administered 2023-05-07: 2.5 [IU]/h via INTRAVENOUS

## 2023-05-06 NOTE — OB Triage Note (Signed)
 Patient is a 23 yo, G4P3, at 36 weeks and 6 days. Patient presents with complaints of contractions. Pt reports her contractions started at 1600 today. Pt also reports being unsure about whether or not her water is broken. She believes her water broke around 1030 this morning. Patient denies any vaginal bleeding. Patient reports +FM. Monitors applied and assessing. Initial fetal heart tone is 135. D. Wilson CNM notified of patients arrival to unit. Plan to do labor eval and rom plus

## 2023-05-06 NOTE — Anesthesia Preprocedure Evaluation (Signed)
 Anesthesia Evaluation  Patient identified by MRN, date of birth, ID band Patient awake    Reviewed: Allergy & Precautions, H&P , NPO status , Patient's Chart, lab work & pertinent test results, reviewed documented beta blocker date and time   Airway Mallampati: II  TM Distance: >3 FB Neck ROM: full    Dental no notable dental hx. (+) Teeth Intact   Pulmonary neg pulmonary ROS   Pulmonary exam normal breath sounds clear to auscultation       Cardiovascular Exercise Tolerance: Good negative cardio ROS  Rhythm:regular Rate:Normal     Neuro/Psych negative neurological ROS  negative psych ROS   GI/Hepatic negative GI ROS, Neg liver ROS,,,  Endo/Other  negative endocrine ROSdiabetes, Gestational    Renal/GU negative Renal ROS  negative genitourinary   Musculoskeletal   Abdominal   Peds  Hematology negative hematology ROS (+)   Anesthesia Other Findings   Reproductive/Obstetrics (+) Pregnancy                             Anesthesia Physical Anesthesia Plan  ASA: 2  Anesthesia Plan: Epidural   Post-op Pain Management:    Induction:   PONV Risk Score and Plan:   Airway Management Planned:   Additional Equipment:   Intra-op Plan:   Post-operative Plan:   Informed Consent: I have reviewed the patients History and Physical, chart, labs and discussed the procedure including the risks, benefits and alternatives for the proposed anesthesia with the patient or authorized representative who has indicated his/her understanding and acceptance.       Plan Discussed with:   Anesthesia Plan Comments:        Anesthesia Quick Evaluation

## 2023-05-06 NOTE — Anesthesia Procedure Notes (Signed)
 Epidural Patient location during procedure: OB End time: 05/06/2023 10:57 PM  Staffing Performed: anesthesiologist   Preanesthetic Checklist Completed: patient identified, IV checked, site marked, risks and benefits discussed, surgical consent, monitors and equipment checked, pre-op evaluation and timeout performed  Epidural Patient position: sitting Prep: Betadine Patient monitoring: heart rate, continuous pulse ox and blood pressure Approach: midline Location: L4-L5 Injection technique: LOR saline  Needle:  Needle type: Tuohy  Needle gauge: 17 G Needle length: 9 cm and 9 Needle insertion depth: 5 cm Catheter type: closed end flexible Catheter size: 19 Gauge Catheter at skin depth: 11 cm Test dose: negative and 1.5% lidocaine with Epi 1:200 K  Assessment Events: blood not aspirated, no cerebrospinal fluid, injection not painful, no injection resistance, no paresthesia and negative IV test  Additional Notes   Patient tolerated the insertion well without complications.Reason for block:procedure for pain

## 2023-05-06 NOTE — H&P (Signed)
 OB History & Physical   History of Present Illness:  Chief Complaint:   HPI:  Crystal Melendez is a 23 y.o. [phone] female at [redacted]w[redacted]d dated by [place].  She presents to L&D for preterm uterine contractions that started at 4pm and have become progressively stronger and closer together.   Pregnancy Issues: 1. Closely spaced pregnancies 2. Sickle cell trait carrier 3. Depression and anxiety 4. Rubella nonimmune   Maternal Medical History:   Past Medical History:  Diagnosis Date   ADHD    states when little; on no medication   Bipolar 1 disorder (HCC)    states dx'ed years ago; no medication   Generalized anxiety disorder 10/06/2021    Past Surgical History:  Procedure Laterality Date   DENTAL SURGERY     all four age 49    Allergies  Allergen Reactions   Sulfa Antibiotics Rash    Prior to Admission medications   Medication Sig Start Date End Date Taking? Authorizing Provider  Prenatal Vit-Fe Fumarate-FA (PRENATAL MULTIVITAMIN) TABS tablet Take 1 tablet by mouth daily at 12 noon.   Yes [provider]  sertraline (ZOLOFT) 25 MG tablet Take 1 tablet by mouth daily. 12/11/22 12/11/23 Yes [provider]  Cranberry (RA CRANBERRY) 500 MG CAPS Take by mouth. Patient not taking: Reported on 03/28/2023    [provider]  Ferrous Sulfate (IRON PO) Take 1 tablet by mouth daily. Patient not taking: Reported on 03/28/2023    [provider]  fluticasone (FLONASE) 50 MCG/ACT nasal spray Place into both nostrils. Patient not taking: Reported on 12/24/2022    [provider]  naproxen (NAPROSYN) 500 MG tablet Take 1 tablet (500 mg total) by mouth 2 (two) times daily with a meal. Patient not taking: Reported on 12/24/2022 08/04/22   Tommi Rumps, PA-C  omeprazole (PRILOSEC) 20 MG capsule Take 20 mg by mouth daily. Patient not taking: Reported on 12/24/2022 06/07/22   [provider]    Prenatal care site: [facility]   Social  History: She  reports that she has never smoked. She has never used smokeless tobacco. She reports that she does not currently use alcohol. She reports that she does not currently use drugs after having used the following drugs: Marijuana. Frequency: 3.00 times per week.  Family History: family history includes Bipolar disorder in her maternal grandmother; Diabetes in her maternal grandmother.   Review of Systems: A full review of systems was performed and negative except as noted in the HPI.     Physical Exam:  Vital Signs: BP 120/75 (BP Location: Right Arm)   Pulse (!) 102   Temp 98.4 F (36.9 C) (Oral)   Resp 18   LMP 08/14/2022  General: no acute distress.  HEENT: normocephalic, atraumatic Heart: regular rate & rhythm.  No murmurs/rubs/gallops Lungs: clear to auscultation bilaterally, normal respiratory effort Abdomen: soft, gravid, non-tender;  EFW: 3000g Pelvic:   External: Normal external female genitalia  Cervix: Dilation: 6.5 /   / Station: -1    Extremities: non-tender, symmetric, mild edema bilaterally.  DTRs: +2  Neurologic: Alert & oriented x 3.    Results for orders placed or performed during the hospital encounter of 05/06/23 (from the past 24 hours)  Rupture of Membrane (ROM) Plus     Status: None   Collection Time: 05/06/23  7:20 PM  Result Value Ref Range   Rom Plus NEGATIVE     Pertinent Results:  Prenatal Labs: Blood type/Rh O pos  Antibody screen neg  Rubella Non-Immune  Varicella Immune  RPR NR  HBsAg Neg  HIV NR  GC neg  Chlamydia neg  Genetic screening negative  1 hour GTT 127  3 hour GTT   GBS Negative   FHT: 130bpm, moderate variability, accelerations present, no decelerations, Category I tracing TOCO: contractions q2-22min SVE:  Dilation: 6.5 /   / Station: -1    Cephalic by leopolds  No results found.  Assessment:  Crystal Melendez is a 23 y.o. [phone] female at [redacted]w[redacted]d with preterm uterine contractions.   Plan:  1. Admit to Labor &  Delivery; consents reviewed and obtained - Dr. Algis Downs Schermerhorn notified of admission and plan of care  2. Fetal Well being  - Fetal Tracing: Category I tracing - Group B Streptococcus ppx indicated: n/a GBS negative - Presentation: vertex confirmed by SVE   3. Routine OB: - Prenatal labs reviewed, as above - Rh O pos - CBC, T&S, RPR on admit - Clear fluids, saline lock  4. Monitoring of Labor -  Contractions q2-44min, external toco in place -  Pelvis proven to 3060g, adequate for trial of labor -  Plan for continuous fetal monitoring  -  Maternal pain control as desired;  - Anticipate vaginal delivery  5. Post Partum Planning: - Infant feeding: breastfeeding - Contraception: Mirena IUD - Tdap: received AP - Flu: received AP  Janyce Llanos, CNM 05/06/23 9:07 PM

## 2023-05-07 ENCOUNTER — Encounter: Payer: Self-pay | Admitting: Obstetrics and Gynecology

## 2023-05-07 DIAGNOSIS — O09899 Supervision of other high risk pregnancies, unspecified trimester: Secondary | ICD-10-CM

## 2023-05-07 DIAGNOSIS — Z2839 Other underimmunization status: Secondary | ICD-10-CM

## 2023-05-07 DIAGNOSIS — F32A Depression, unspecified: Secondary | ICD-10-CM

## 2023-05-07 DIAGNOSIS — D573 Sickle-cell trait: Secondary | ICD-10-CM

## 2023-05-07 LAB — URINE DRUG SCREEN, QUALITATIVE (ARMC ONLY)
Amphetamines, Ur Screen: NOT DETECTED
Barbiturates, Ur Screen: NOT DETECTED
Benzodiazepine, Ur Scrn: NOT DETECTED
Cannabinoid 50 Ng, Ur ~~LOC~~: NOT DETECTED
Cocaine Metabolite,Ur ~~LOC~~: NOT DETECTED
MDMA (Ecstasy)Ur Screen: NOT DETECTED
Methadone Scn, Ur: NOT DETECTED
Opiate, Ur Screen: NOT DETECTED
Phencyclidine (PCP) Ur S: NOT DETECTED
Tricyclic, Ur Screen: NOT DETECTED

## 2023-05-07 LAB — RPR: RPR Ser Ql: NONREACTIVE

## 2023-05-07 MED ORDER — PRENATAL MULTIVITAMIN CH
1.0000 | ORAL_TABLET | Freq: Every day | ORAL | Status: DC
  Administered 2023-05-07: 1 via ORAL
  Filled 2023-05-07: qty 1

## 2023-05-07 MED ORDER — SIMETHICONE 80 MG PO CHEW: 80.0000 mg | CHEWABLE_TABLET | ORAL | Status: DC | PRN

## 2023-05-07 MED ORDER — DIPHENHYDRAMINE HCL 25 MG PO CAPS: 25.0000 mg | ORAL_CAPSULE | Freq: Four times a day (QID) | ORAL | Status: DC | PRN

## 2023-05-07 MED ORDER — ONDANSETRON HCL 4 MG PO TABS: 4.0000 mg | ORAL_TABLET | ORAL | Status: DC | PRN

## 2023-05-07 MED ORDER — DIBUCAINE (PERIANAL) 1 % EX OINT: 1.0000 | TOPICAL_OINTMENT | CUTANEOUS | Status: DC | PRN

## 2023-05-07 MED ORDER — BENZOCAINE-MENTHOL 20-0.5 % EX AERO
1.0000 | INHALATION_SPRAY | CUTANEOUS | Status: DC | PRN
  Administered 2023-05-07: 1 via TOPICAL
  Filled 2023-05-07: qty 56

## 2023-05-07 MED ORDER — ACETAMINOPHEN 325 MG PO TABS: 650.0000 mg | ORAL_TABLET | ORAL | Status: DC | PRN

## 2023-05-07 MED ORDER — WITCH HAZEL-GLYCERIN EX PADS
1.0000 | MEDICATED_PAD | CUTANEOUS | Status: DC | PRN
Start: 2023-05-07 — End: 2023-05-08

## 2023-05-07 MED ORDER — ONDANSETRON HCL 4 MG/2ML IJ SOLN: 4.0000 mg | INTRAMUSCULAR | Status: DC | PRN

## 2023-05-07 MED ORDER — ZOLPIDEM TARTRATE 5 MG PO TABS: 5.0000 mg | ORAL_TABLET | Freq: Every evening | ORAL | Status: DC | PRN

## 2023-05-07 MED ORDER — SENNOSIDES-DOCUSATE SODIUM 8.6-50 MG PO TABS: 2.0000 | ORAL_TABLET | Freq: Every day | ORAL | Status: DC

## 2023-05-07 MED ORDER — TETANUS-DIPHTH-ACELL PERTUSSIS 5-2.5-18.5 LF-MCG/0.5 IM SUSY: 0.5000 mL | PREFILLED_SYRINGE | Freq: Once | INTRAMUSCULAR | Status: DC

## 2023-05-07 MED ORDER — COCONUT OIL OIL
1.0000 | TOPICAL_OIL | Status: DC | PRN
  Administered 2023-05-08: 1 via TOPICAL
  Filled 2023-05-07: qty 7.5

## 2023-05-07 MED ORDER — IBUPROFEN 600 MG PO TABS
600.0000 mg | ORAL_TABLET | Freq: Four times a day (QID) | ORAL | Status: DC
  Administered 2023-05-07 – 2023-05-08 (×6): 600 mg via ORAL
  Filled 2023-05-07 (×6): qty 1

## 2023-05-07 NOTE — Lactation Note (Signed)
 This note was copied from a baby's chart. Lactation Consultation Note  Patient Name: Crystal Melendez [id number] Date: 05/07/2023 Age:23 hours Reason for consult: Initial assessment  "Crystal Melendez" is 3 hours old born via vag-spontaneous.   Maternal Data G4P4 mother plans to [facility] Crystal Melendez as long as she can, and does not plan to pump. BF experience includes her 23 year old was BF for 6 months and her 23 year old was BF for 2.months.   Feeding Mother's Current Feeding Choice: Breast Milk  LATCH Score Latch: Too sleepy or reluctant, no latch achieved, no sucking elicited.  Audible Swallowing: None  Type of Nipple: Everted at rest and after stimulation  Comfort (Breast/Nipple): Soft / non-tender  Hold (Positioning): No assistance needed to correctly position infant at breast.  LATCH Score: 6  Interventions Interventions: Skin to skin;Education;Breast feeding basics reviewed Breastfeeding attempted by unswaddling infant and changing her dry diaper. Infant showed feeding cues but proceeded to spit up and fall back asleep. STS facilitated.  Milk production, STS benefits, milk production, hunger cues,  and feeding on demand education provided.  Consult Status Consult Status: Follow-up Date: 05/07/23 Follow-up type: In-patient   Chinita Greenland 05/07/2023, 10:12 AM

## 2023-05-07 NOTE — Lactation Note (Signed)
 This note was copied from a baby's chart. Lactation Consultation Note  Patient Name: Girl Ambria Mayfield [id number] Date: 05/07/2023 Age:23 hours Reason for consult: Follow-up assessment;RN request  "Jaci Carrel" is 6 hours old. RN request for BF assistance. Upon entering room, infant was latched on mothers left breast in cradle position.   Maternal Data Does the patient have breastfeeding experience prior to this delivery?: Yes How long did the patient breastfeed?: 8 months  Feeding Mother's Current Feeding Choice: Breast Milk "Jaci Carrel" latched successfully onto the left breast by cradle position, for a total of 14 minutes. Upon detaching from breast, infant showed sleepy behavior and was swaddled in mothers arms. Recommended mother to attempt feeding on the right breast during the next feeding session, to continue breast stimulation on both breast; no pumping necessary with this consistent routine.   LATCH Score Latch: Grasps breast easily, tongue down, lips flanged, rhythmical sucking.  Audible Swallowing: Spontaneous and intermittent  Type of Nipple: Everted at rest and after stimulation  Comfort (Breast/Nipple): Soft / non-tender  Hold (Positioning): No assistance needed to correctly position infant at breast.  LATCH Score: 10  Interventions Interventions: Breast compression  Consult Status Consult Status: Follow-up Follow-up type: In-patient   Chinita Greenland 05/07/2023, 12:08 PM

## 2023-05-07 NOTE — Progress Notes (Signed)
 Labor Progress Note  Crystal Melendez is a 23 y.o. [phone] at [redacted]w[redacted]d by ultrasound admitted for active labor  Subjective: she reports rectal pressure  Objective: BP 109/69   Pulse (!) 104   Temp 98.7 F (37.1 C) (Oral)   Resp 18   Ht 5\' 1"  (1.549 m)   Wt 80.3 kg   LMP 08/14/2022   SpO2 99%   BMI 33.44 kg/m  Notable VS details: reviewed  Fetal Assessment: FHT:  FHR: 115 bpm, variability: moderate,  accelerations:  Present,  decelerations:  Absent Category/reactivity:  Category I UC:   regular, every 2-3 minutes SVE:    Dilation: 10cm  Effacement: 100%  Station:  +2  Consistency: soft  Position: anterior  Membrane status:AROM @ 0103 Amniotic color: clear  Labs: Lab Results  Component Value Date   WBC 12.8 (H) 05/06/2023   HGB 12.2 05/06/2023   HCT 36.1 05/06/2023   MCV 87.4 05/06/2023   PLT 279 05/06/2023    Assessment / Plan: 23 year old G4P3003 at [redacted]w[redacted]d with active labor, now second stage  Labor:  Begin pushing at this time. NICU notified of early-term delivery Preeclampsia:  labs stable Fetal Wellbeing:  Category I Pain Control:  Epidural I/D:   GBS negative Anticipated MOD:  NSVD  Janyce Llanos, CNM 05/07/2023, 5:38 AM

## 2023-05-07 NOTE — Progress Notes (Signed)
 Labor Progress Note  Crystal Melendez is a 23 y.o. [phone] at [redacted]w[redacted]d by ultrasound admitted for active labor  Subjective: she is comfortable after her epidural  Objective: BP 101/65   Pulse 86   Temp 97.8 F (36.6 C) (Axillary)   Resp 18   Ht 5\' 1"  (1.549 m)   Wt 80.3 kg   LMP 08/14/2022   SpO2 98%   BMI 33.44 kg/m  Notable VS details: reviewed  Fetal Assessment: FHT:  FHR: 125 bpm, variability: moderate,  accelerations:  Present,  decelerations:  Absent Category/reactivity:  Category I UC:   regular, every 2-5 minutes SVE:    Dilation: 7.5cm  Effacement: 70%  Station:  -1  Consistency: medium  Position: middle  Membrane status:AROM @ 0103 Amniotic color: clear  Labs: Lab Results  Component Value Date   WBC 12.8 (H) 05/06/2023   HGB 12.2 05/06/2023   HCT 36.1 05/06/2023   MCV 87.4 05/06/2023   PLT 279 05/06/2023    Assessment / Plan: Spontaneous labor, progressing normally  Labor:  She is laboring, has made good cervical change, AROM with clear fluid Preeclampsia:  labs stable Fetal Wellbeing:  Category I Pain Control:  Epidural I/D:   GBS negative Anticipated MOD:  NSVD  Janyce Llanos, CNM 05/07/2023, 1:05 AM

## 2023-05-07 NOTE — Discharge Summary (Signed)
 Postpartum Discharge Summary  Patient Name: Crystal Melendez DOB: February 17, 2000 MRN: [id number]  Date of admission: 05/06/2023 Delivery date:05/07/2023 Delivering provider: Donato Schultz RENEE Date of discharge: 05/08/2023  Primary OB: [facility] WUJ:[id number] last menstrual period was 08/14/2022. 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 [facility])   Discharge Diagnosis: Term Pregnancy Delivered                                                Post partum procedures: None Augmentation:: AROM Complications: None Delivery Type: spontaneous vaginal delivery Anesthesia: epidural anesthesia Placenta: spontaneous To Pathology: No  Laceration: none Episiotomy: none  Prenatal Labs:  Blood type/Rh O pos  Antibody screen neg  Rubella Non-Immune  Varicella Immune  RPR NR  HBsAg Neg  HIV NR  GC neg  Chlamydia neg  Genetic screening negative  1 hour GTT 127  3 hour GTT    GBS Negative    Hospital course: Onset of Labor With Vaginal Delivery      23 y.o. yo [id number] at [redacted]w[redacted]d was admitted in Active Labor on 05/06/2023. Labor course was without complication. She progressed to 10/100/+2 and pushed once, delivering viable female infant over intact perineum. Membrane Rupture Time/Date: 1:03 AM,05/07/2023  Delivery Method:Vaginal, Spontaneous Operative Delivery:N/A Episiotomy: None Lacerations:  None Patient had a postpartum course complicated by none.  She is  ambulating, tolerating a regular diet, passing flatus, and urinating well. Patient is discharged home in stable condition on 05/08/23.  Newborn Data: Birth date:05/07/2023 Birth time:5:18 AM Gender:Female Living status:Living Apgars:7 ,9  Weight:2760 g  Magnesium Sulfate received: No BMZ received: No Rhophylac:No MMR: offered prior to discharge  Varivax vaccine given: was not indicated T-DaP:Given prenatally Flu: Yes  Transfusion:No  Physical exam  Vitals:   05/07/23 1921 05/07/23 2322 05/08/23 0745 05/08/23 1055  BP: 115/72 103/64 99/60   Pulse: 100 88 (!) 113   Resp: 18 18 18    Temp: 97.8 F (36.6 C) (!) 97.5 F (36.4 C) 99.8 F (37.7 C) 99 F (37.2 C)  TempSrc: Oral  Oral Oral  SpO2: 98% 98% 97%   Weight:      Height:       General: alert, cooperative, and no distress Lochia: appropriate Uterine Fundus: firm Perineum:minimal edema/intact DVT Evaluation: No evidence of DVT seen on physical exam.  Labs: Lab Results  Component Value Date   WBC 8.9 05/08/2023   HGB 11.9 (L) 05/08/2023   HCT 36.3 05/08/2023   MCV 89.4 05/08/2023   PLT 227 05/08/2023      Latest Ref Rng & Units 09/16/2022   10:45 PM  CMP  Glucose 70 - 99 mg/dL 92   BUN 6 - 20 mg/dL 11   Creatinine 0.86 - 1.00 mg/dL 5.78   Sodium 469 - 629 mmol/L 136   Potassium 3.5 - 5.1 mmol/L 3.8   Chloride 98 - 111 mmol/L 105   CO2 22 -  32 mmol/L 21   Calcium 8.9 - 10.3 mg/dL 9.3   Total Protein 6.5 - 8.1 g/dL 7.8   Total Bilirubin 0.3 - 1.2 mg/dL 0.7   Alkaline Phos 38 - 126 U/L 57   AST 15 - 41 U/L 18   ALT 0 - 44 U/L 20    Edinburgh Score:    05/08/2023    9:58 AM  Edinburgh Postnatal Depression Scale Screening Tool  I have been able to laugh and see the funny side of things. 0  I have looked forward with enjoyment to things. 0  I have blamed myself unnecessarily when things went wrong. 0  I have been anxious or worried for no good reason. 1  I have felt scared or panicky for no good reason.  1  Things have been getting on top of me. 2  I have been so unhappy that I have had difficulty sleeping. 1  I have felt sad or miserable. 1  I have been so unhappy that I have been crying. 0  The thought of harming myself has occurred to me. 0  Edinburgh Postnatal Depression Scale Total 6    Risk assessment for postpartum VTE and prophylactic treatment: Very high risk factors: None High risk factors: None Moderate risk factors: None  Postpartum VTE prophylaxis with LMWH not indicated  After visit meds:  Allergies as of 05/08/2023       Reactions   Sulfa Antibiotics Rash        Medication List     STOP taking these medications    fluticasone 50 MCG/ACT nasal spray Commonly known as: FLONASE   naproxen 500 MG tablet Commonly known as: Naprosyn   omeprazole 20 MG capsule Commonly known as: PRILOSEC   RA Cranberry 500 MG Caps Generic drug: Cranberry       TAKE these medications    acetaminophen 325 MG tablet Commonly known as: Tylenol Take 2 tablets (650 mg total) by mouth every 4 (four) hours as needed (for pain scale < 4).   ibuprofen 600 MG tablet Commonly known as: ADVIL Take 1 tablet (600 mg total) by mouth every 6 (six) hours as needed for mild pain (pain score 1-3) or cramping.   IRON PO Take 1 tablet by mouth daily.   prenatal multivitamin Tabs tablet Take 1 tablet by mouth daily at 12 noon.   sertraline 25 MG tablet Commonly known as: ZOLOFT Take 1 tablet by mouth daily.       Discharge home in stable condition Infant Feeding: Breast Infant Disposition:home with mother Discharge instruction: per After Visit Summary and Postpartum booklet. Activity: Advance as tolerated. Pelvic rest for 6 weeks.  Diet: routine diet Anticipated Birth Control:  Mirena IUD Postpartum Appointment:6 weeks Additional Postpartum F/U: Postpartum Depression checkup Future Appointments:No future appointments. Follow up Visit:  Follow-up Information     Janyce Llanos, CNM Follow up in 2 week(s).   Specialty: Certified Nurse Midwife Why: 2wk mood check Contact information: [address] [place] [address] [phone]         Janyce Llanos, CNM Follow up in 6 week(s).   Specialty: Certified Nurse Midwife Why: 6wk postpartum Contact information: [address] [place] [address] [phone]                 Plan:  Crystal Melendez was discharged to home in good condition. Follow-up appointment as directed.    Signed:  Gustavo Lah, CNM 05/08/2023 1:18 PM  Margaretmary Eddy, CNM Certified Nurse Midwife [facility]

## 2023-05-08 LAB — CBC
HCT: 36.3 % (ref 36.0–46.0)
Hemoglobin: 11.9 g/dL — ABNORMAL LOW (ref 12.0–15.0)
MCH: 29.3 pg (ref 26.0–34.0)
MCHC: 32.8 g/dL (ref 30.0–36.0)
MCV: 89.4 fL (ref 80.0–100.0)
Platelets: 227 10*3/uL (ref 150–400)
RBC: 4.06 MIL/uL (ref 3.87–5.11)
RDW: 13.2 % (ref 11.5–15.5)
WBC: 8.9 10*3/uL (ref 4.0–10.5)
nRBC: 0 % (ref 0.0–0.2)

## 2023-05-08 MED ORDER — ACETAMINOPHEN 325 MG PO TABS: 650.0000 mg | ORAL_TABLET | ORAL | Status: AC | PRN

## 2023-05-08 MED ORDER — IBUPROFEN 600 MG PO TABS
600.0000 mg | ORAL_TABLET | Freq: Four times a day (QID) | ORAL | 0 refills | Status: AC | PRN
Start: 2023-05-08 — End: ?

## 2023-05-08 NOTE — Discharge Instructions (Signed)
 Vaginal Delivery, Care After Refer to this sheet in the next few weeks. These discharge instructions provide you with information on caring for yourself after delivery. Your caregiver may also give you specific instructions. Your treatment has been planned according to the most current medical practices available, but problems sometimes occur. Call your caregiver if you have any problems or questions after you go home. HOME CARE INSTRUCTIONS Take over-the-counter or prescription medicines only as directed by your caregiver or pharmacist. Do not drink alcohol, especially if you are breastfeeding or taking medicine to relieve pain. Do not smoke tobacco. Continue to use good perineal care. Good perineal care includes: Wiping your perineum from back to front Keeping your perineum clean. You can do sitz baths twice a day, to help keep this area clean Do not use tampons, douche or have sex until your caregiver says it is okay. Shower only and avoid sitting in submerged water, aside from sitz baths Wear a well-fitting bra that provides breast support. Eat healthy foods. Drink enough fluids to keep your urine clear or pale yellow. Eat high-fiber foods such as whole grain cereals and breads, brown rice, beans, and fresh fruits and vegetables every day. These foods may help prevent or relieve constipation. Avoid constipation with high fiber foods or medications, such as miralax or metamucil Follow your caregiver's recommendations regarding resumption of activities such as climbing stairs, driving, lifting, exercising, or traveling. Talk to your caregiver about resuming sexual activities. Resumption of sexual activities is dependent upon your risk of infection, your rate of healing, and your comfort and desire to resume sexual activity. Try to have someone help you with your household activities and your newborn for at least a few days after you leave the hospital. Rest as much as possible. Try to rest or  take a nap when your newborn is sleeping. Increase your activities gradually. Keep all of your scheduled postpartum appointments. It is very important to keep your scheduled follow-up appointments. At these appointments, your caregiver will be checking to make sure that you are healing physically and emotionally. SEEK MEDICAL CARE IF:  You are passing large clots from your vagina. Save any clots to show your caregiver. You have a foul smelling discharge from your vagina. You have trouble urinating. You are urinating frequently. You have pain when you urinate. You have a change in your bowel movements. You have increasing redness, pain, or swelling near your vaginal incision (episiotomy) or vaginal tear. You have pus draining from your episiotomy or vaginal tear. Your episiotomy or vaginal tear is separating. You have painful, hard, or reddened breasts. You have a severe headache. You have blurred vision or see spots. You feel sad or depressed. You have thoughts of hurting yourself or your newborn. You have questions about your care, the care of your newborn, or medicines. You are dizzy or light-headed. You have a rash. You have nausea or vomiting. You were breastfeeding and have not had a menstrual period within 12 weeks after you stopped breastfeeding. You are not breastfeeding and have not had a menstrual period by the 12th week after delivery. You have a fever. SEEK IMMEDIATE MEDICAL CARE IF:  You have persistent pain. You have chest pain. You have shortness of breath. You faint. You have leg pain. You have stomach pain. Your vaginal bleeding saturates two or more sanitary pads in 1 hour. MAKE SURE YOU:  Understand these instructions. Will watch your condition. Will get help right away if you are not doing well or  get worse. Document Released: 01/13/2000 Document Revised: 06/01/2013 Document Reviewed: 09/12/2011 [facility] Patient Information 2015 [place], [place]. This  information is not intended to replace advice given to you by your health care provider. Make sure you discuss any questions you have with your health care provider.  Sitz Bath A sitz bath is a warm water bath taken in the sitting position. The water covers only the hips and butt (buttocks). We recommend using one that fits in the toilet, to help with ease of use and cleanliness. It may be used for either healing or cleaning purposes. Sitz baths are also used to relieve pain, itching, or muscle tightening (spasms). The water may contain medicine. Moist heat will help you heal and relax.  HOME CARE  Take 3 to 4 sitz baths a day. Fill the bathtub half-full with warm water. Sit in the water and open the drain a little. Turn on the warm water to keep the tub half-full. Keep the water running constantly. Soak in the water for 15 to 20 minutes. After the sitz bath, pat the affected area dry. GET HELP RIGHT AWAY IF: You get worse instead of better. Stop the sitz baths if you get worse. MAKE SURE YOU: Understand these instructions. Will watch your condition. Will get help right away if you are not doing well or get worse. Document Released: 02/23/2004 Document Revised: 10/10/2011 Document Reviewed: 05/15/2010 [facility] Patient Information 2015 [place], [place]. This information is not intended to replace advice given to you by your health care provider. Make sure you discuss any questions you have with your health care provider.

## 2023-05-08 NOTE — Anesthesia Postprocedure Evaluation (Signed)
 Anesthesia Post Note  Patient: Crystal Melendez  Procedure(s) Performed: AN AD HOC LABOR EPIDURAL  Patient location during evaluation: Mother Baby Anesthesia Type: Epidural Level of consciousness: awake and alert and oriented Pain management: pain level controlled Vital Signs Assessment: post-procedure vital signs reviewed and stable Respiratory status: spontaneous breathing and respiratory function stable Cardiovascular status: blood pressure returned to baseline Postop Assessment: no headache, no backache, no apparent nausea or vomiting, adequate PO intake and able to ambulate Anesthetic complications: no   No notable events documented.   Last Vitals:  Vitals:   05/07/23 2322 05/08/23 0745  BP: 103/64 99/60  Pulse: 88 (!) 113  Resp: 18 18  Temp: (!) 36.4 C 37.7 C  SpO2: 98% 97%    Last Pain:  Vitals:   05/08/23 0745  TempSrc: Oral  PainSc:                  Katherine Basset

## 2023-05-08 NOTE — Progress Notes (Signed)
 Postpartum Day  1  Subjective: 23 y.o. [id number] postpartum day #1 status post normal spontaneous vaginal delivery. She is ambulating, is tolerating po, is voiding spontaneously.  Her pain is well controlled on PO pain medications. Her lochia is less than menses.  Objective: BP 99/60 (BP Location: Left Arm)   Pulse (!) 113   Temp 99.8 F (37.7 C) (Oral)   Resp 18   Ht 5\' 1"  (1.549 m)   Wt 80.3 kg   LMP 08/14/2022   SpO2 97%   Breastfeeding Unknown   BMI 33.44 kg/m    Physical Exam:  General: alert, cooperative, and no distress Breasts: soft/nontender Pulm: nl effort Abdomen: soft, non-tender, active bowel sounds Uterine Fundus: firm Perineum: minimal edema, intact Lochia: appropriate DVT Evaluation: No evidence of DVT seen on physical exam.  Recent Labs    05/06/23 2130 05/08/23 0533  HGB 12.2 11.9*  HCT 36.1 36.3  WBC 12.8* 8.9  PLT 279 227    Assessment/Plan: 22 y.o. [id number] postpartum day # 1  1. Continue routine postpartum care  2. Infant feeding status: breast feeding -Lactation consult PRN for breastfeeding   3. Contraception plan: IUD  4. Immunization status:   needs MMR prior to discharge    Disposition: continue inpatient postpartum care , desires discharge home today    LOS: 2 days   Gustavo Lah, Ina Homes 05/08/2023, 10:22 AM   ----- Margaretmary Eddy  Certified Nurse Midwife [facility]

## 2023-05-08 NOTE — Plan of Care (Signed)

## 2023-05-08 NOTE — Progress Notes (Signed)
 Pt called to the desk to have her temp retaken. Patient with an oral temp of 99.0. Pt complains of feeling aching and cold. No increase in bleeding and fundus feels firm at 1 below. Instructed patient she may be experiencing symptoms of cold or other virus circulating but to keep [place] informed if symptoms worsen or she has changes in her bleeding. Patient verbalized understanding.

## 2023-05-08 NOTE — Lactation Note (Signed)
 This note was copied from a baby's chart. Lactation Consultation Note  Patient Name: Crystal Melendez [id number] Date: 05/08/2023 Age:23 hours Reason for consult: Follow-up assessment   Maternal Data This is mom's 4th baby, SVD. Mom with history of generalized anxiety disorder and depression. On follow-up visit today mom reports baby is latching and breastfeeding well. Mom experiencing some normative nipple tenderness, nipples are intact. Has patient been taught Hand Expression?: Yes Does the patient have breastfeeding experience prior to this delivery?: Yes How long did the patient breastfeed?: Mom breastfed her 2nd and 3rd child  Feeding Mother's Current Feeding Choice: Breast Milk  LATCH Score Latch: Grasps breast easily, tongue down, lips flanged, rhythmical sucking.  Audible Swallowing: Spontaneous and intermittent  Type of Nipple: Everted at rest and after stimulation  Comfort (Breast/Nipple): Soft / non-tender  Hold (Positioning): No assistance needed to correctly position infant at breast.  LATCH Score: 10   Interventions Interventions: Breast feeding basics reviewed;Education;Coconut oil  Discharge Discharge Education: Engorgement and breast care;Warning signs for feeding baby;Outpatient recommendation Pump: Personal  Consult Status Consult Status: Complete Date: 05/08/23 Follow-up type: In-patient  Update provided to care nurse.  Fuller Song 05/08/2023, 12:56 PM

## 2024-02-01 IMAGING — US US OB < 14 WEEKS - US OB TV
1 series · 14 of 28 positions shown · non-contrast
Comparison: None.

CLINICAL DATA: Vaginal bleeding

EXAM:
OBSTETRIC <14 WK ULTRASOUND
TECHNIQUE: Transabdominal ultrasound was performed for evaluation of the
gestation as well as the maternal uterus and adnexal regions.

[Series 1: us ob less than 14 weeks with ob transvaginal · 14 of 109 slices shown]
[im 5/109]
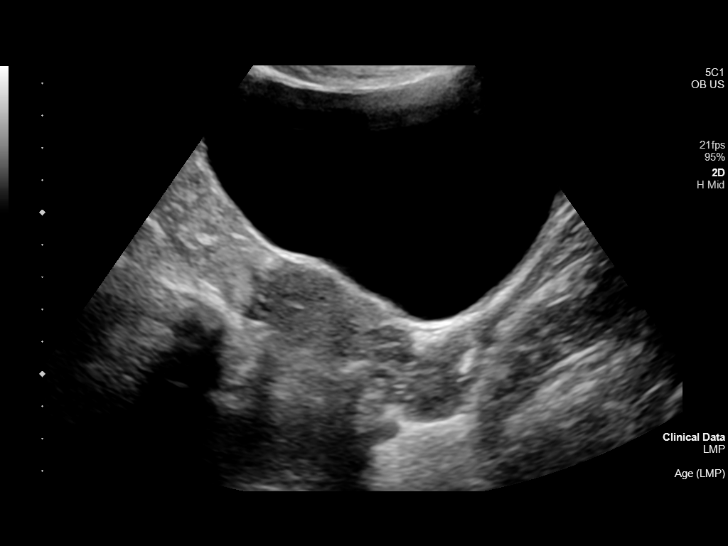
[im 13/109]
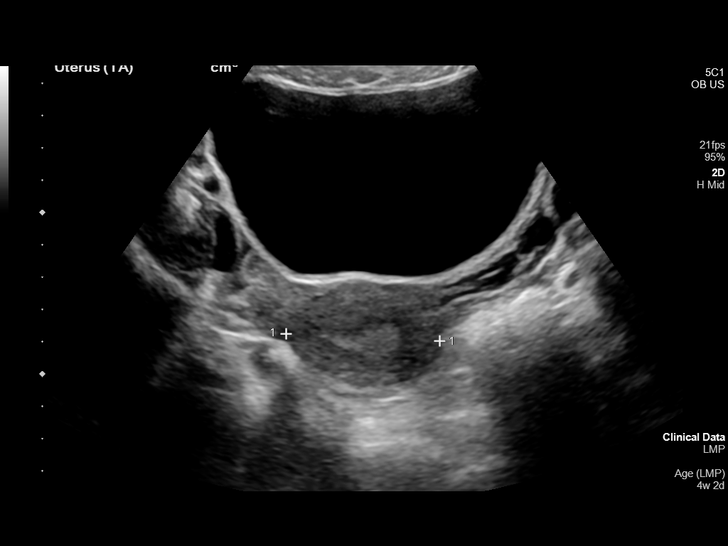
[im 21/109]
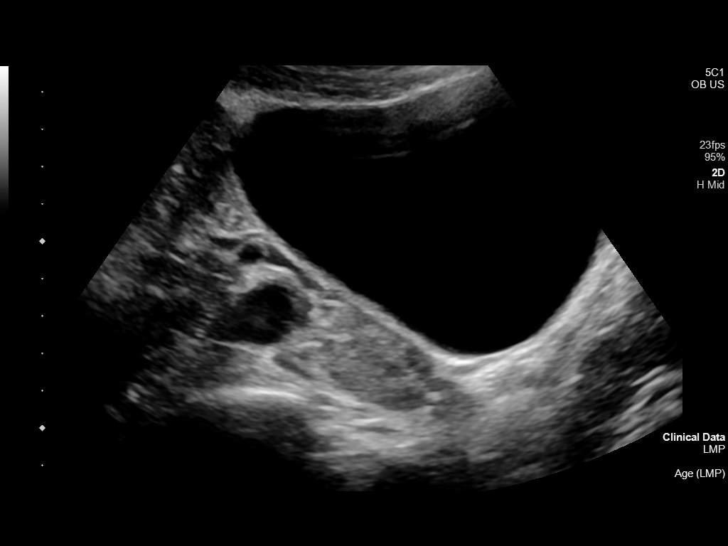
[im 29/109]
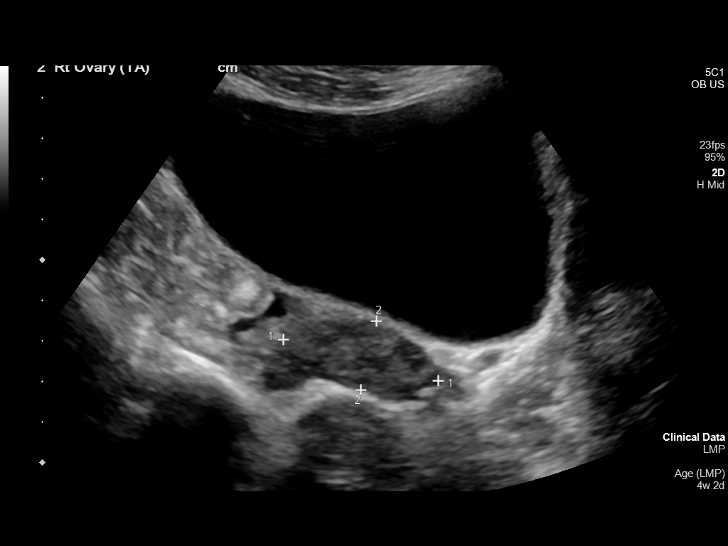
[im 37/109]
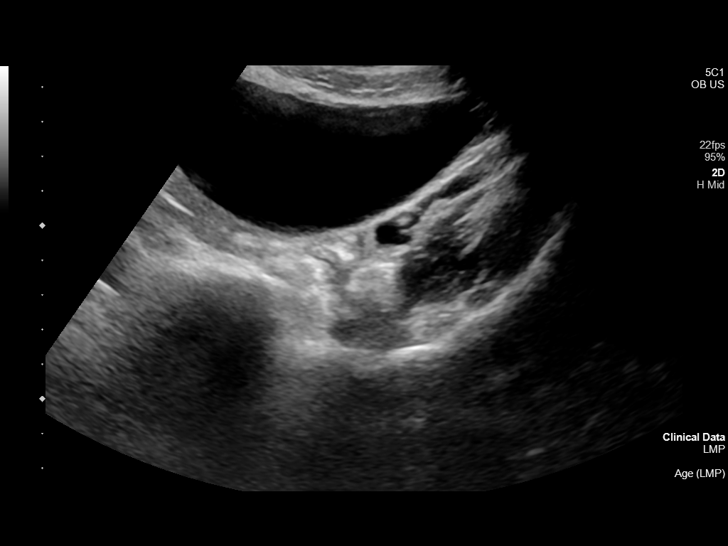
[im 45/109]
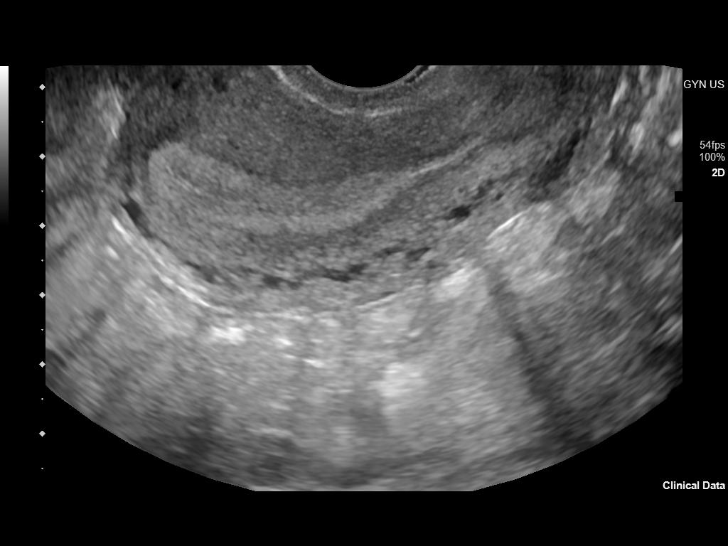
[im 53/109]
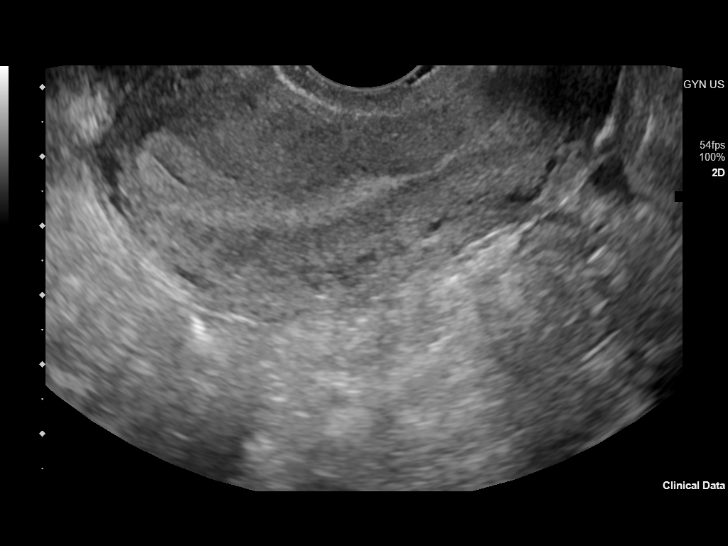
[im 61/109]
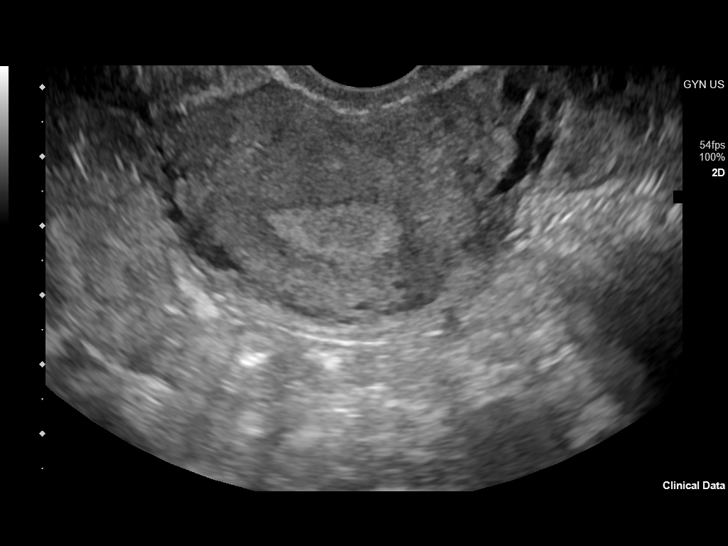
[im 69/109]
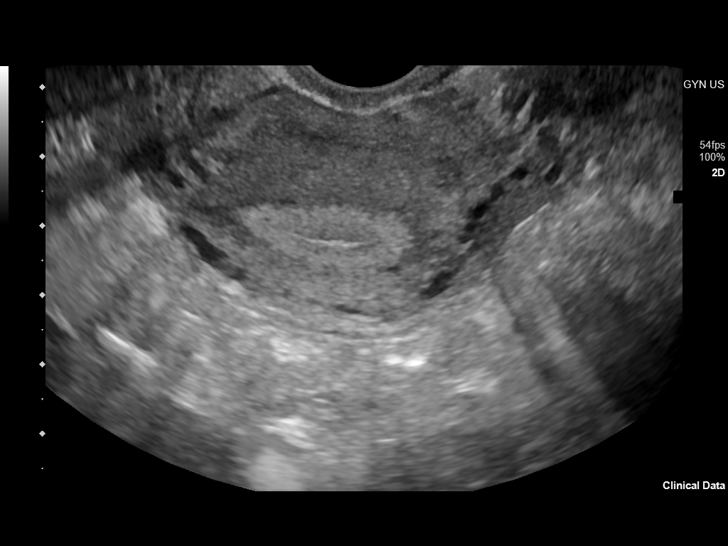
[im 77/109]
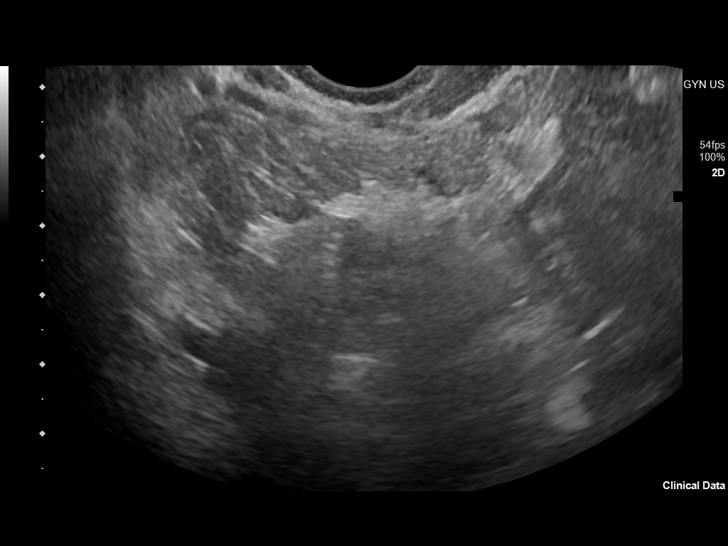
[im 85/109]
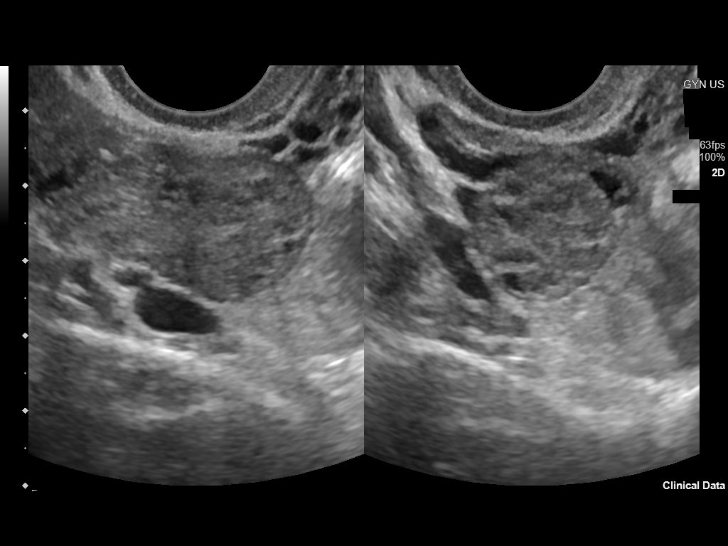
[im 93/109]
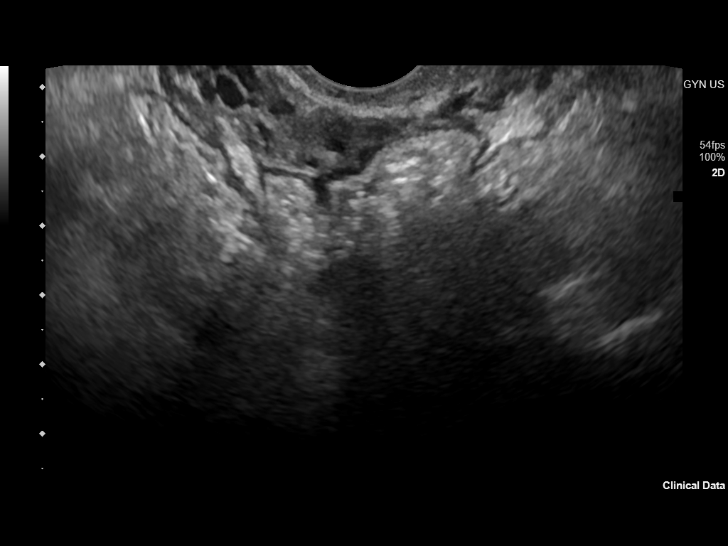
[im 101/109]
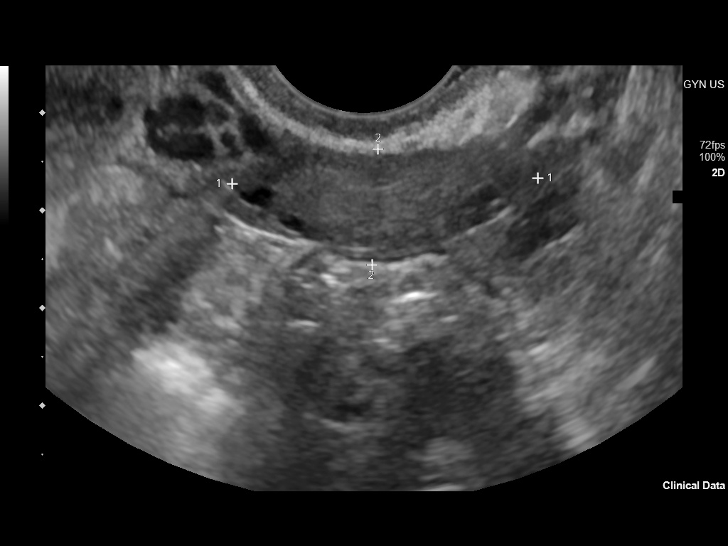
[im 109/109]
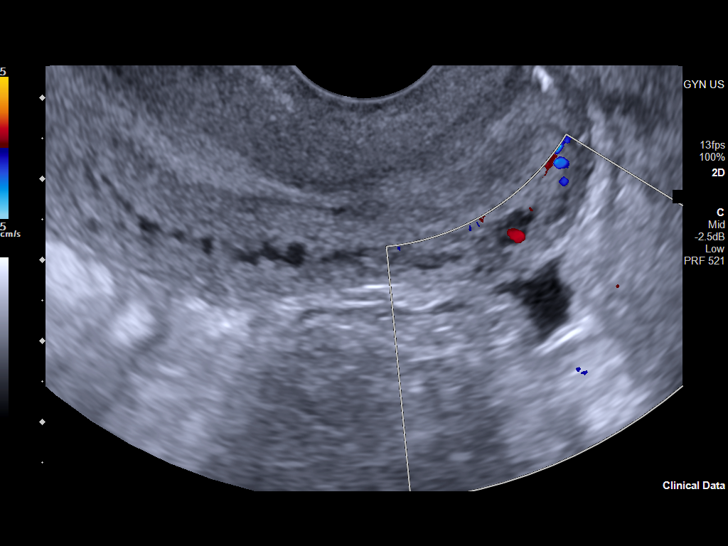

[14 of 28 positions shown; findings below may reference images not displayed]

FINDINGS: Intrauterine gestational sac: None

Yolk sac:  Not Visualized.

Embryo:  Not Visualized.

Cardiac Activity: Not Visualized.

Heart Rate: Not visualized.

Subchorionic hemorrhage:  None visualized.

Maternal uterus/adnexae: Corpus luteum of the right ovary.
IMPRESSION: No candidate intrauterine gestation identified. Early pregnancy of
uncertain location. Recommend ectopic precautions, serial beta HCG
and follow-up ultrasound in 7-14 days to determine viability.

## 2024-04-11 IMAGING — US US ABDOMEN LIMITED
1 series · 14 of 25 positions shown · non-contrast
Comparison: None Available.

CLINICAL DATA: [id number].  acute epigastric pain

EXAM:
ULTRASOUND ABDOMEN LIMITED RIGHT UPPER QUADRANT

[Series 1: us abdomen limited ruq (liver/gb) · 14 of 38 slices shown]
[im 1/38]
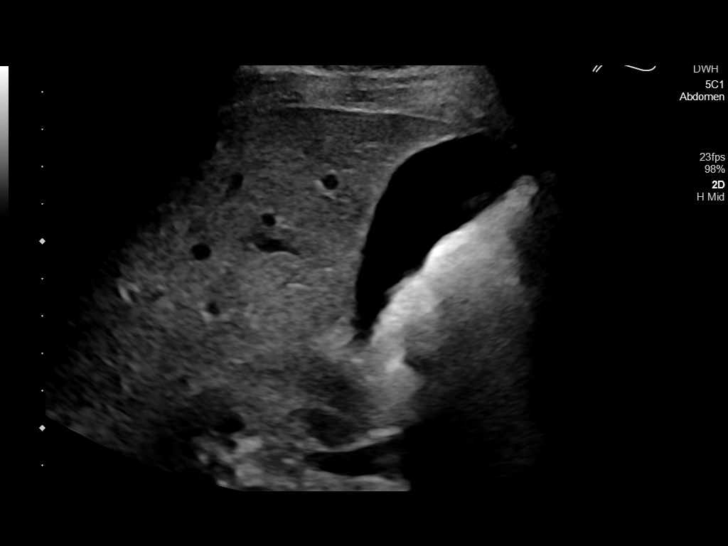
[im 4/38]
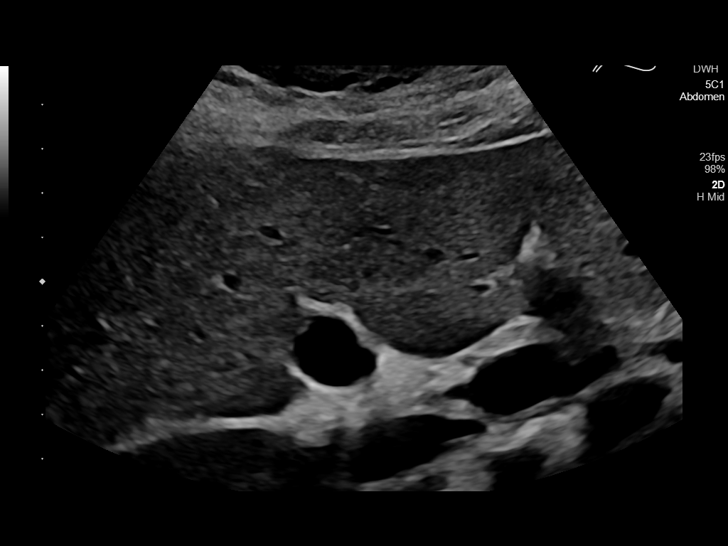
[im 7/38]
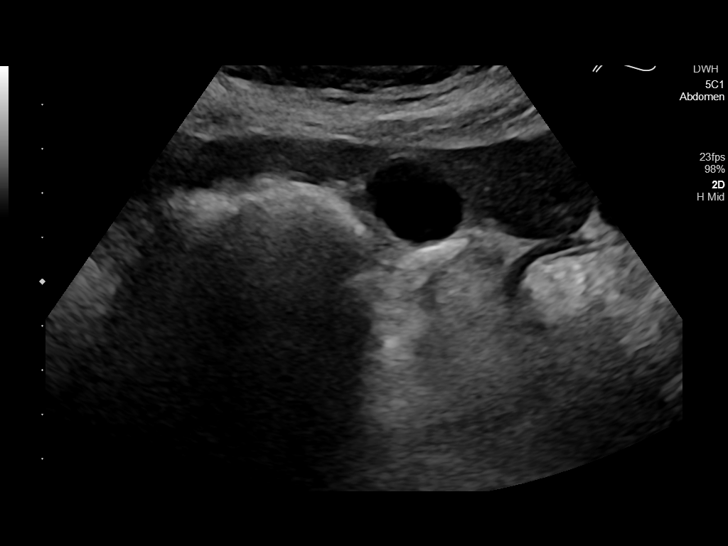
[im 10/38]
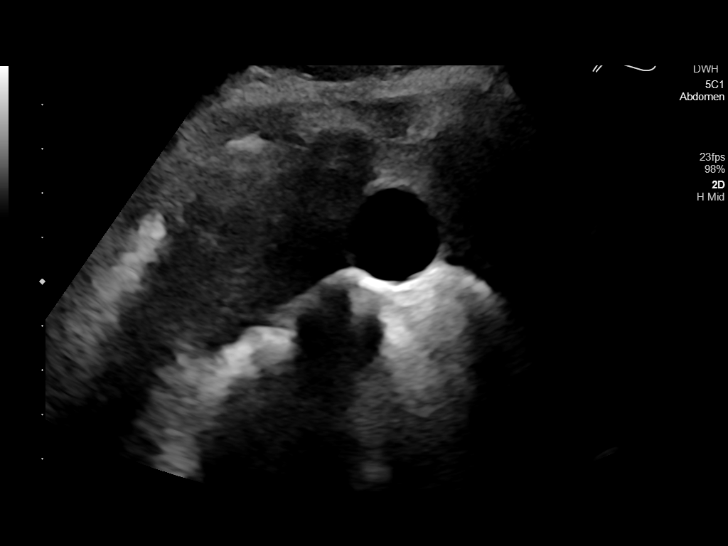
[im 13/38]
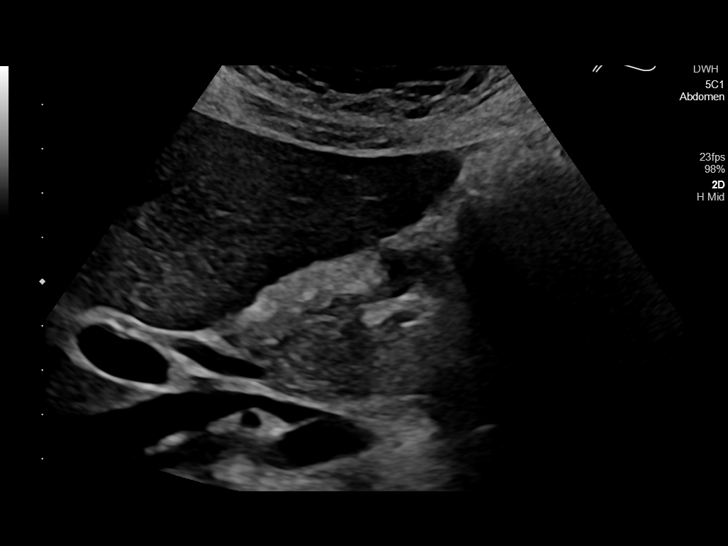
[im 14/38]
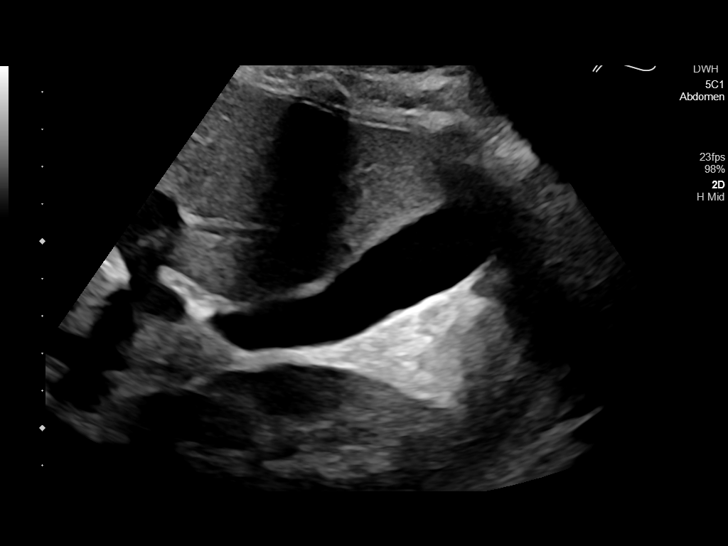
[im 17/38]
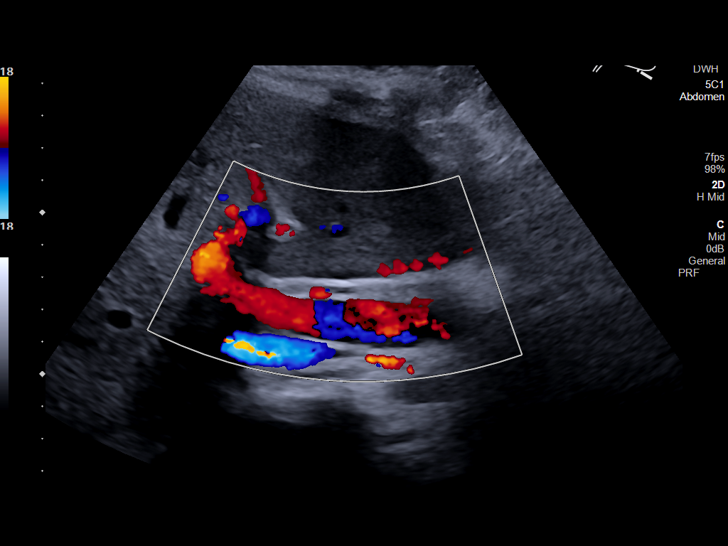
[im 21/38]
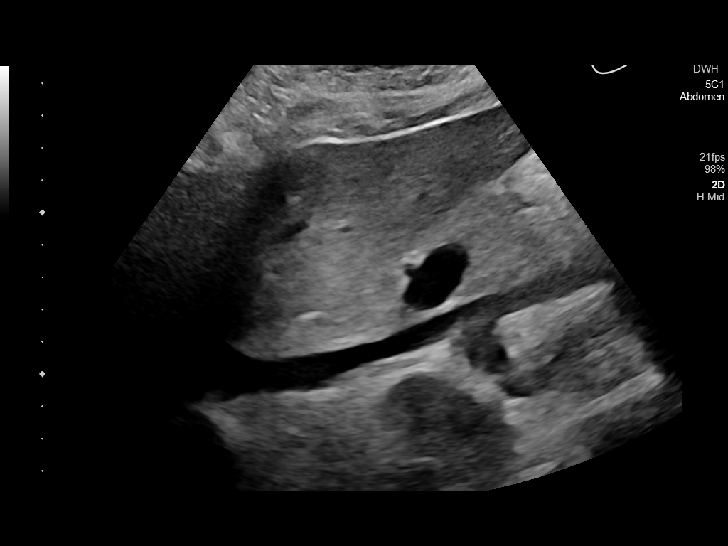
[im 24/38]
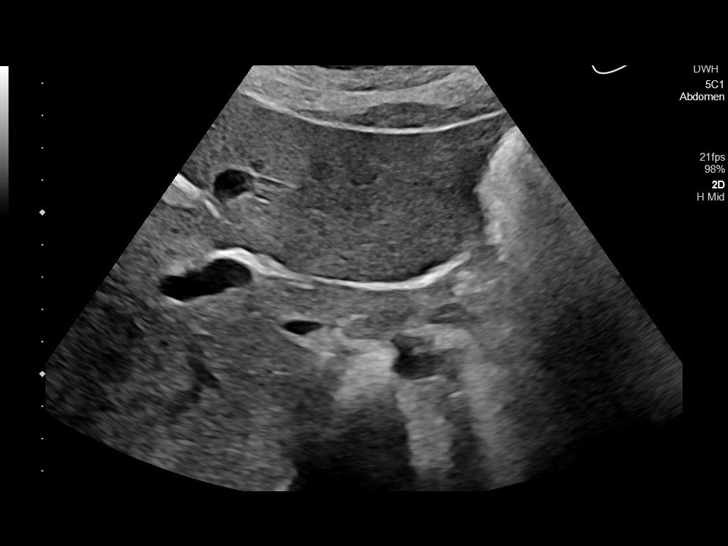
[im 25/38]
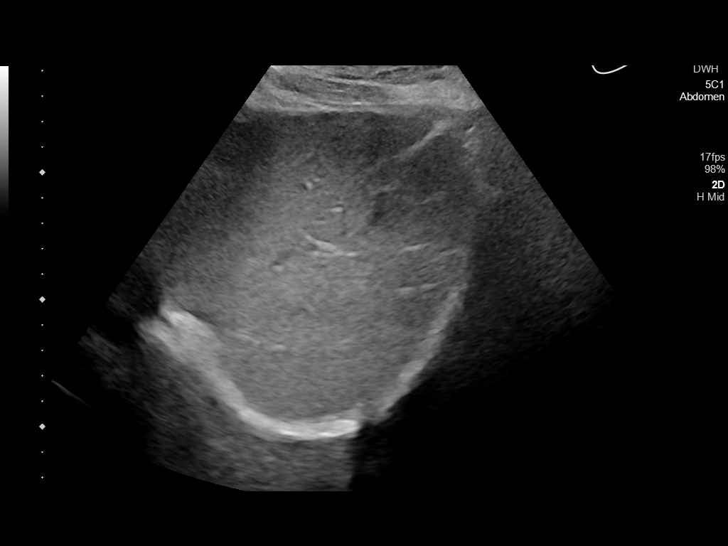
[im 28/38]
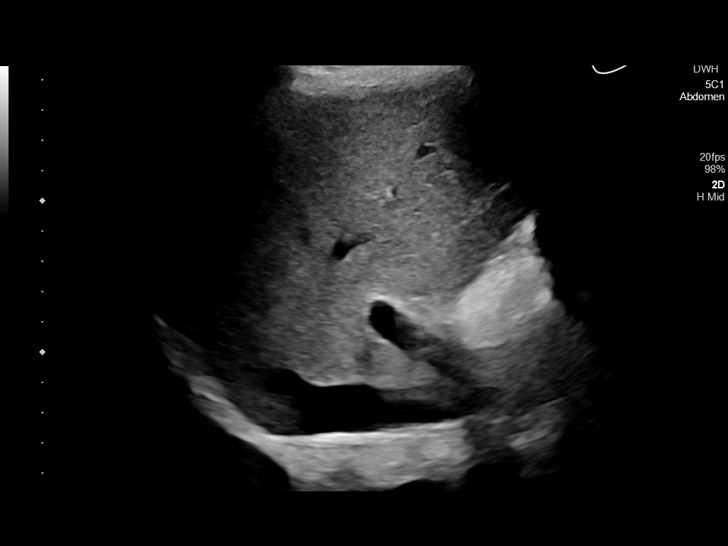
[im 31/38]
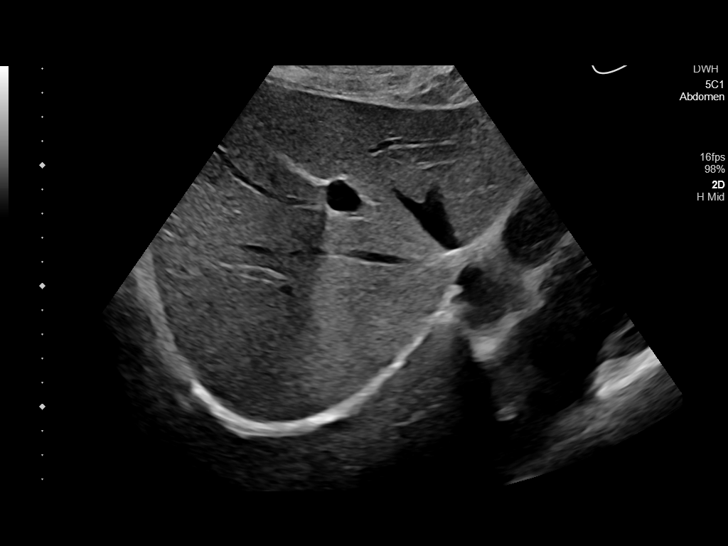
[im 34/38]
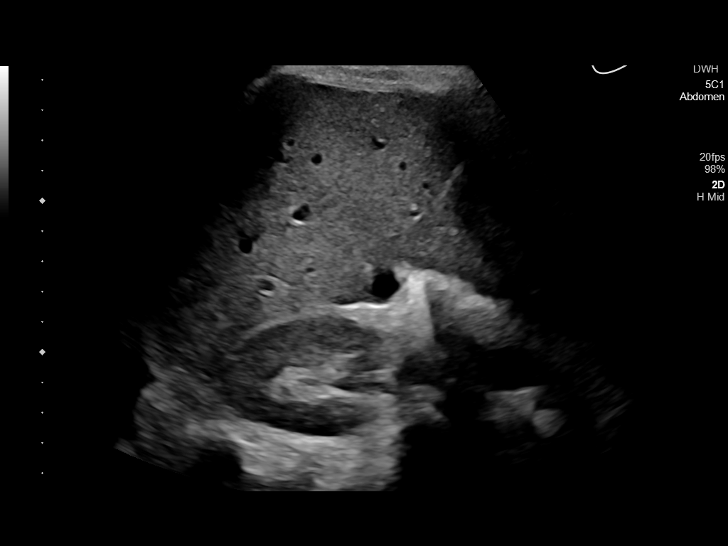
[im 38/38]
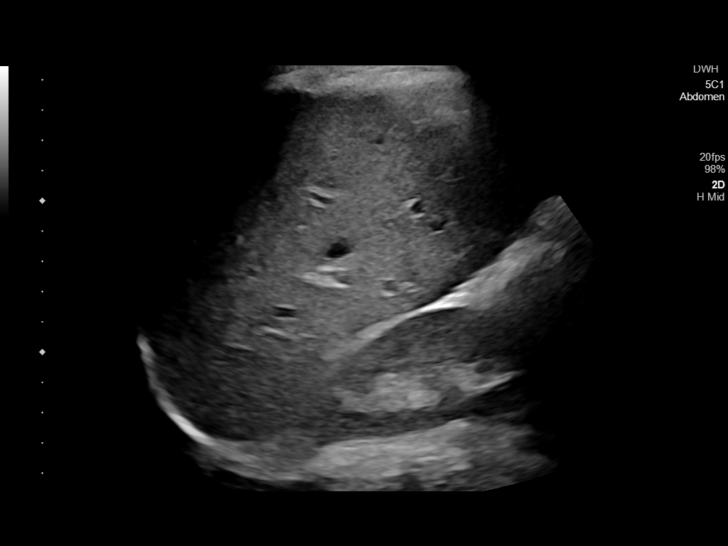

[14 of 25 positions shown; findings below may reference images not displayed]

FINDINGS: Gallbladder:

No gallstones or wall thickening visualized. No sonographic Murphy
sign noted by sonographer.

Common bile duct:

Diameter: 3mm.

Liver:

No focal lesion identified. Within normal limits in parenchymal
echogenicity. Portal vein is patent on color Doppler imaging with
normal direction of blood flow towards the liver.

Other: None.
IMPRESSION: Unremarkable right upper quadrant ultrasound.
